# Patient Record
Sex: Female | Born: 1995 | Hispanic: Yes | Marital: Single | Smoking: Never smoker
Health system: Southern US, Community
[De-identification: ages and names within clinical notes are randomized; demographics above are authoritative.]

---

## 2021-10-03 ENCOUNTER — Emergency Department (HOSPITAL_COMMUNITY): Payer: Self-pay

## 2021-10-03 ENCOUNTER — Inpatient Hospital Stay (HOSPITAL_COMMUNITY): Payer: Self-pay

## 2021-10-03 ENCOUNTER — Inpatient Hospital Stay (HOSPITAL_COMMUNITY)
Admission: EM | Admit: 2021-10-03 | Discharge: 2021-10-05 | DRG: 121 | Disposition: A | Discharge status: Home / Self Care | Payer: Self-pay | Attending: Internal Medicine | Admitting: Internal Medicine

## 2021-10-03 ENCOUNTER — Encounter (HOSPITAL_COMMUNITY): Payer: Self-pay | Admitting: Emergency Medicine

## 2021-10-03 ENCOUNTER — Other Ambulatory Visit: Payer: Self-pay

## 2021-10-03 DIAGNOSIS — Z20822 Contact with and (suspected) exposure to covid-19: Secondary | ICD-10-CM | POA: Diagnosis present

## 2021-10-03 DIAGNOSIS — H469 Unspecified optic neuritis: Secondary | ICD-10-CM | POA: Diagnosis present

## 2021-10-03 DIAGNOSIS — H5712 Ocular pain, left eye: Secondary | ICD-10-CM | POA: Diagnosis present

## 2021-10-03 DIAGNOSIS — Z23 Encounter for immunization: Secondary | ICD-10-CM

## 2021-10-03 DIAGNOSIS — E871 Hypo-osmolality and hyponatremia: Secondary | ICD-10-CM | POA: Diagnosis present

## 2021-10-03 DIAGNOSIS — R030 Elevated blood-pressure reading, without diagnosis of hypertension: Secondary | ICD-10-CM | POA: Diagnosis present

## 2021-10-03 DIAGNOSIS — H05012 Cellulitis of left orbit: Principal | ICD-10-CM | POA: Diagnosis present

## 2021-10-03 LAB — CBC WITH DIFFERENTIAL/PLATELET
Abs Immature Granulocytes: 0.03 10*3/uL (ref 0.00–0.07)
Basophils Absolute: 0 10*3/uL (ref 0.0–0.1)
Basophils Relative: 0 %
Eosinophils Absolute: 0.2 10*3/uL (ref 0.0–0.5)
Eosinophils Relative: 2 %
HCT: 37.5 % (ref 36.0–46.0)
Hemoglobin: 11.8 g/dL — ABNORMAL LOW (ref 12.0–15.0)
Immature Granulocytes: 0 %
Lymphocytes Relative: 37 %
Lymphs Abs: 3.1 10*3/uL (ref 0.7–4.0)
MCH: 27.4 pg (ref 26.0–34.0)
MCHC: 31.5 g/dL (ref 30.0–36.0)
MCV: 87 fL (ref 80.0–100.0)
Monocytes Absolute: 0.4 10*3/uL (ref 0.1–1.0)
Monocytes Relative: 5 %
Neutro Abs: 4.7 10*3/uL (ref 1.7–7.7)
Neutrophils Relative %: 56 %
Platelets: 376 10*3/uL (ref 150–400)
RBC: 4.31 MIL/uL (ref 3.87–5.11)
RDW: 12.8 % (ref 11.5–15.5)
WBC: 8.4 10*3/uL (ref 4.0–10.5)
nRBC: 0 % (ref 0.0–0.2)

## 2021-10-03 LAB — COMPREHENSIVE METABOLIC PANEL
ALT: 13 U/L (ref 0–44)
AST: 12 U/L — ABNORMAL LOW (ref 15–41)
Albumin: 4.3 g/dL (ref 3.5–5.0)
Alkaline Phosphatase: 94 U/L (ref 38–126)
Anion gap: 5 (ref 5–15)
BUN: 8 mg/dL (ref 6–20)
CO2: 25 mmol/L (ref 22–32)
Calcium: 8.7 mg/dL — ABNORMAL LOW (ref 8.9–10.3)
Chloride: 108 mmol/L (ref 98–111)
Creatinine, Ser: 0.48 mg/dL (ref 0.44–1.00)
GFR, Estimated: >60 mL/min (ref >60)
Glucose, Bld: 100 mg/dL — ABNORMAL HIGH (ref 70–99)
Potassium: 4.6 mmol/L (ref 3.5–5.1)
Sodium: 138 mmol/L (ref 135–145)
Total Bilirubin: 0.3 mg/dL (ref 0.3–1.2)
Total Protein: 7.7 g/dL (ref 6.5–8.1)

## 2021-10-03 LAB — RESP PANEL BY RT-PCR (FLU A&B, COVID) ARPGX2
Influenza A by PCR: NEGATIVE
Influenza B by PCR: NEGATIVE
SARS Coronavirus 2 by RT PCR: NEGATIVE

## 2021-10-03 LAB — HCG, SERUM, QUALITATIVE: Preg, Serum: NEGATIVE

## 2021-10-03 IMAGING — CT CT ORBITS W/ CM
3 series · 14 of 47 positions shown, 16 images · IV contrast (omnipaque)
Comparison: No pertinent prior exam.

CLINICAL DATA: Orbital cellulitis

EXAM:
CT ORBITS WITH CONTRAST
TECHNIQUE: Multidetector CT images was performed according to the standard
protocol following intravenous contrast administration.
CONTRAST:  80mL OMNIPAQUE IOHEXOL 350 MG/ML SOLN

[Series 3: orbits 2.0 st · axial · 0.43mm/px · z∈[-119,-33]mm · 8 of 51 slices shown, 10 images]
[im 4/51  brain]
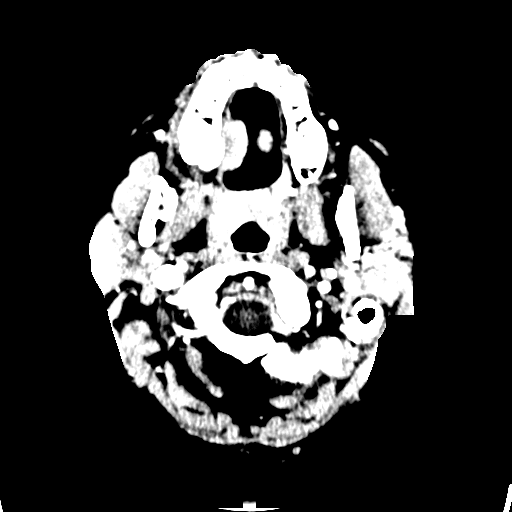
[im 4/51  bone]
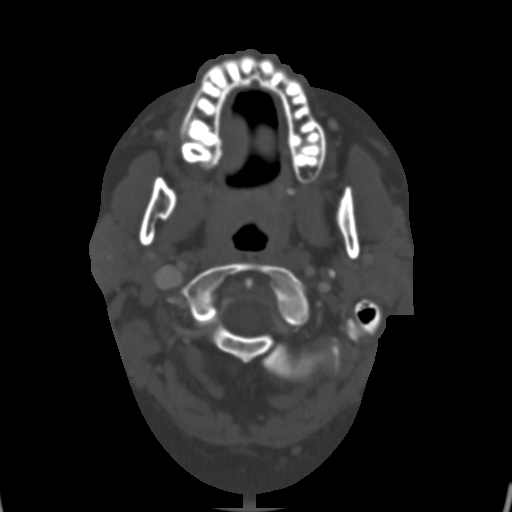
[im 11/51  bone]
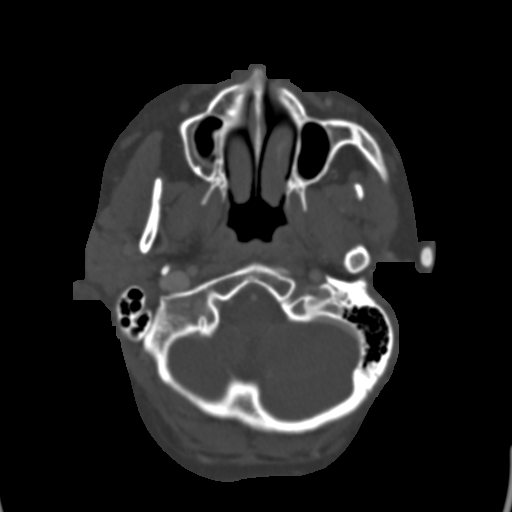
[im 16/51  bone]
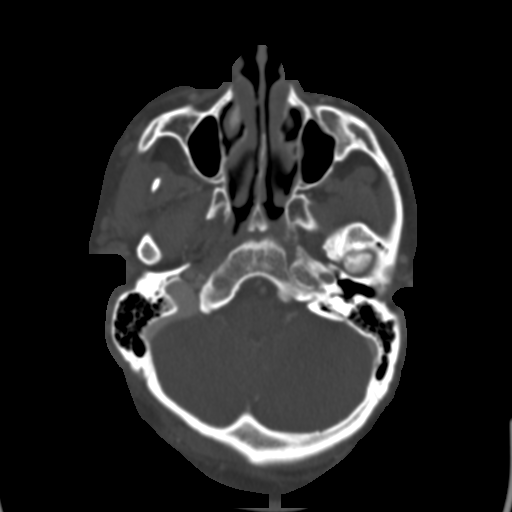
[im 23/51  bone]
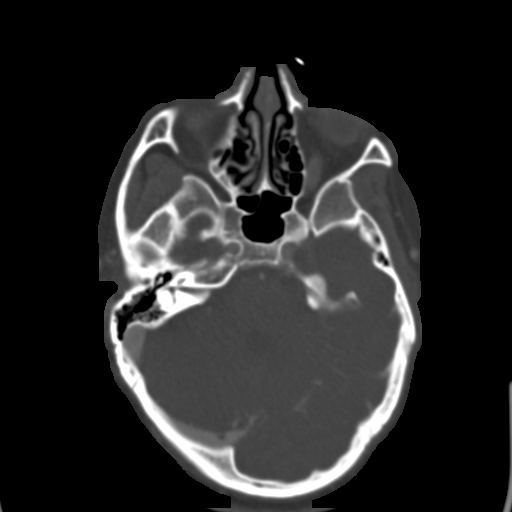
[im 28/51  brain]
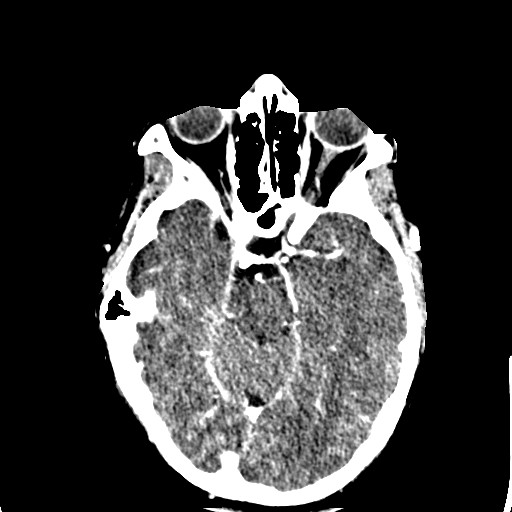
[im 28/51  bone]
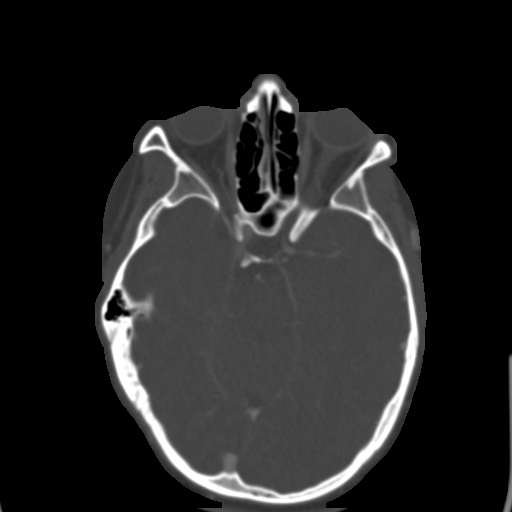
[im 35/51  bone]
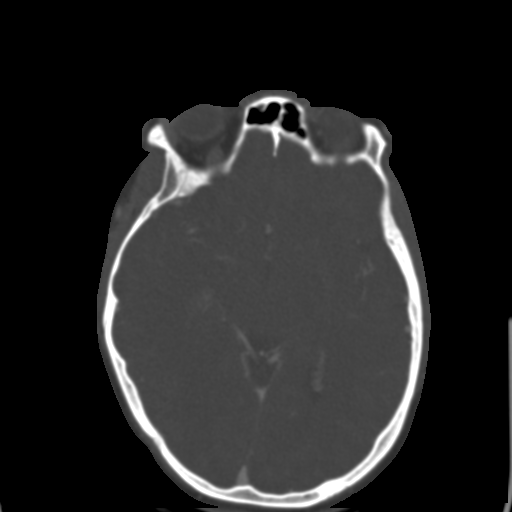
[im 40/51  bone]
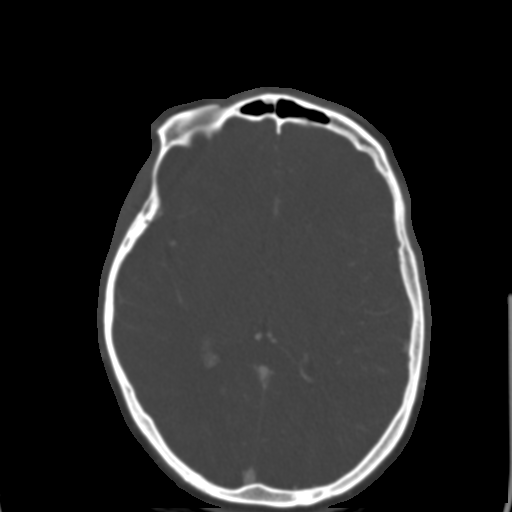
[im 47/51  bone]
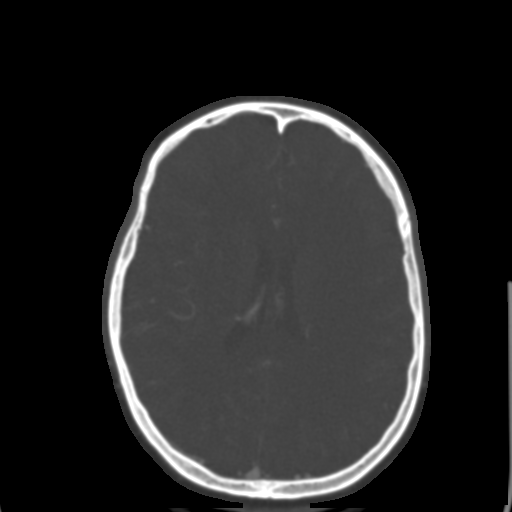

[Series 7: orbits coronal st · coronal · 0.23mm/px · 3 of 90 slices shown]
[im 30/90  bone]
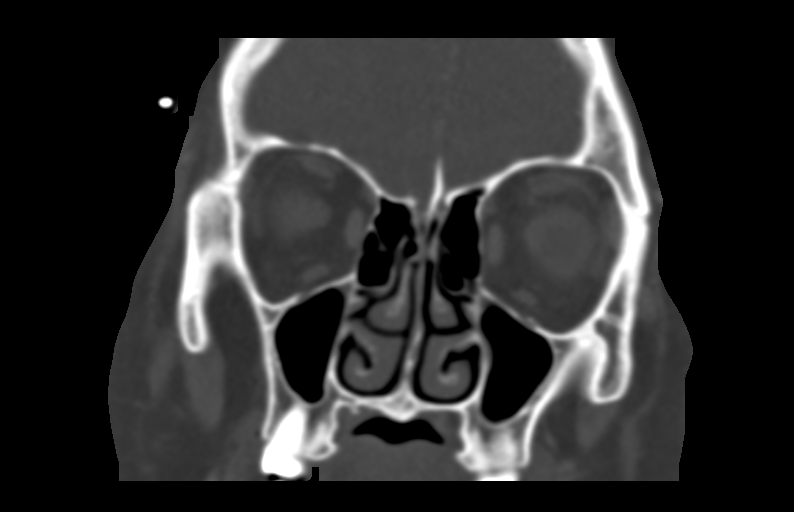
[im 40/90  bone]
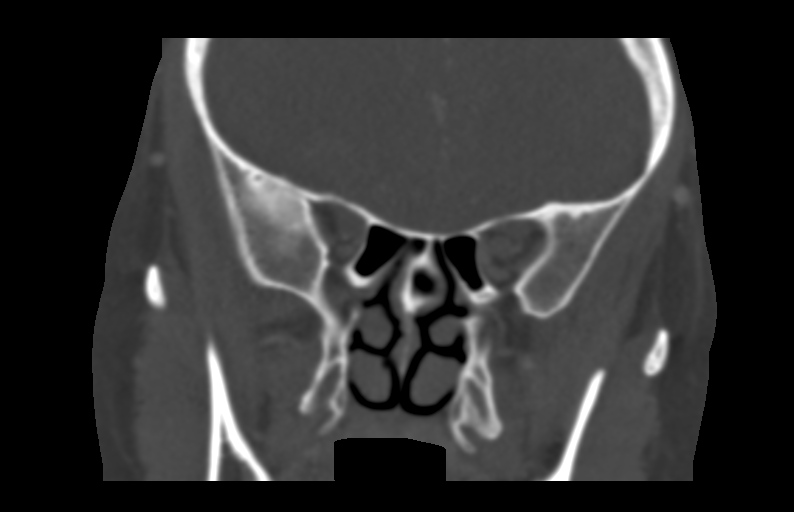
[im 50/90  bone]
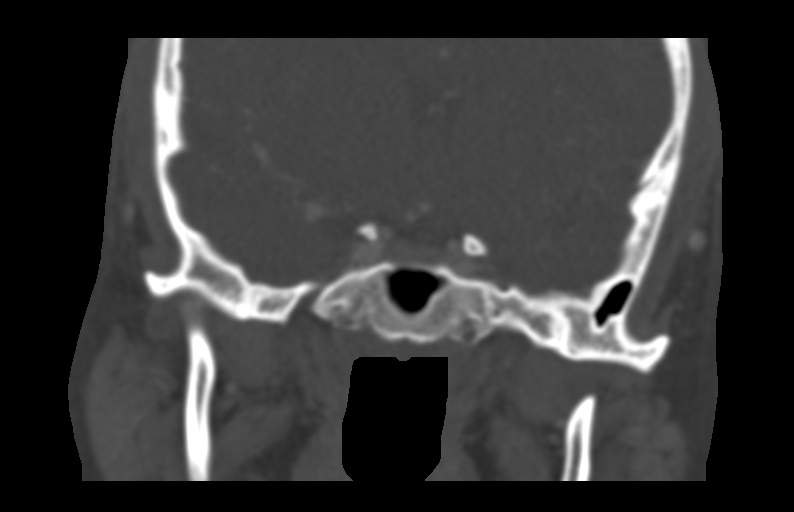

[Series 8: orbits sagittal st · sagittal · 0.27mm/px · 3 of 90 slices shown]
[im 30/90  bone]
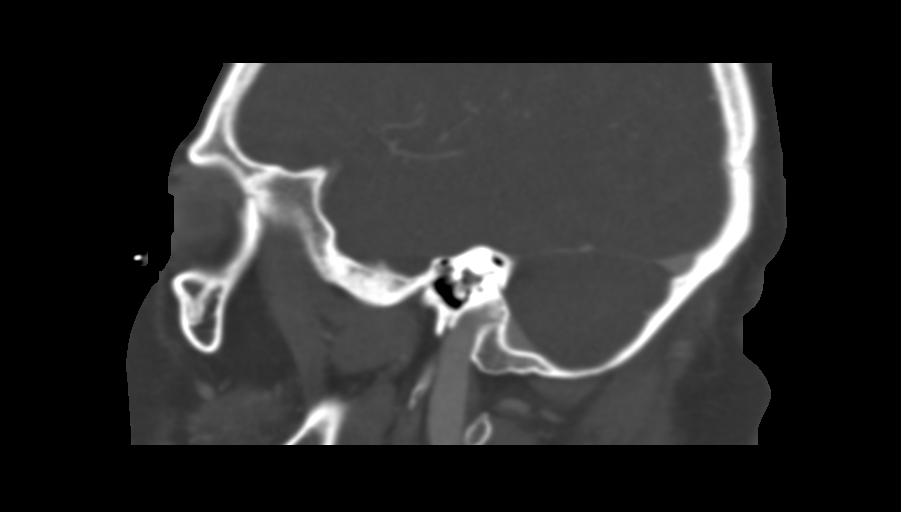
[im 45/90  bone]
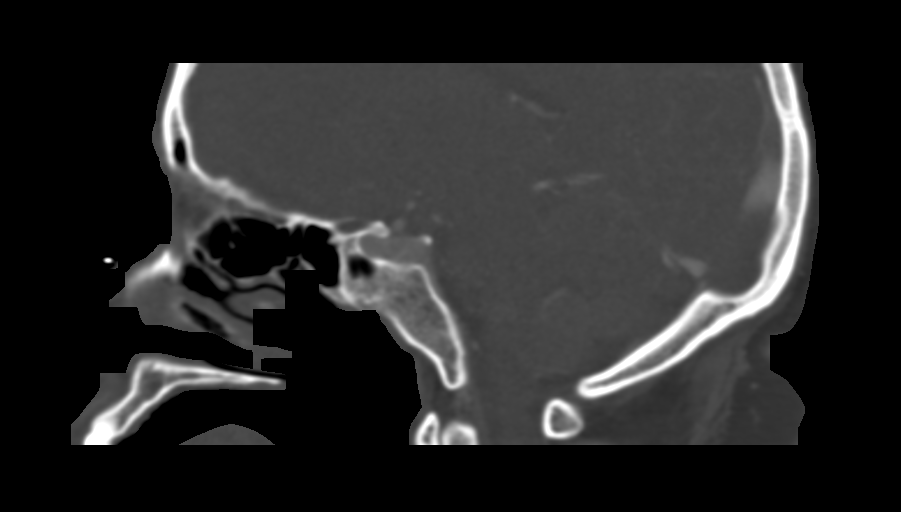
[im 60/90  bone]
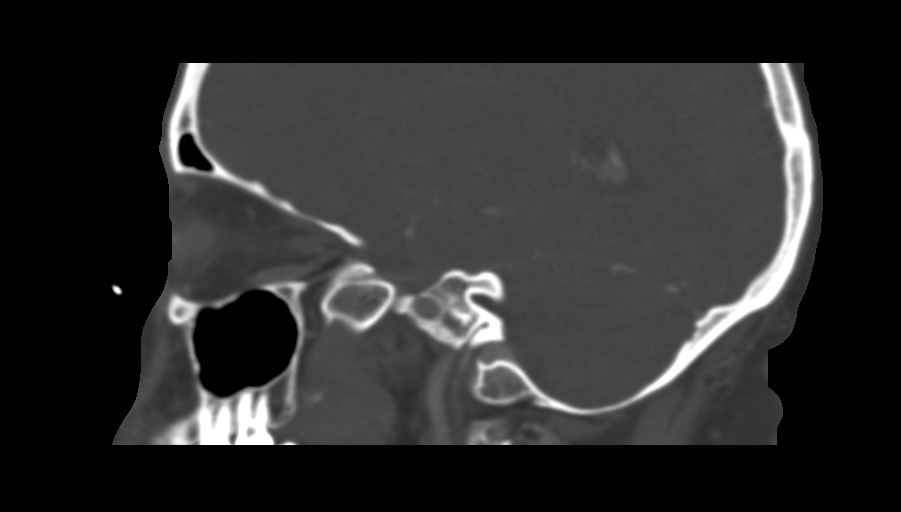

[14 of 47 positions shown; findings below may reference images not displayed]

FINDINGS: Orbits: Inferior and lateral to the left orbit is a dense nodular
area measuring 1.2 x 0.9 cm. There is no surrounding inflammatory
change. Additionally, there is mild thickening of the left superior
rectus muscle with hazy stranding in the adjacent fat. The optic
nerves and globes appear normal.

Visible paranasal sinuses: Clear.

Soft tissues: Normal.

Osseous: No fracture or aggressive lesion.

Limited intracranial: No acute or significant finding.
IMPRESSION: 1. Mild thickening of the left superior rectus muscle with hazy
stranding in the adjacent fat, possibly indicating orbital
cellulitis. This may indicate an inflammatory process, such as
idiopathic orbital inflammation (pseudotumor). Dedicated MRI orbits
with and without contrast recommended.
2. Dense nodule inferior and lateral to the left orbit, within the
deep subcutaneous tissues, favored to be a small vascular
malformation.

## 2021-10-03 IMAGING — MR MR ORBITS WO/W CM
7 of 8 series · 43 of 48 positions shown · IV contrast (GADAVIST)
Comparison: No pertinent prior exam.

CLINICAL DATA: Left eye pain

EXAM:
MRI OF THE ORBITS WITHOUT AND WITH CONTRAST
TECHNIQUE: Multiplanar, multi-echo pulse sequences of the orbits and
surrounding structures were acquired including fat saturation
techniques, before and after intravenous contrast administration.
CONTRAST:  10mL GADAVIST GADOBUTROL 1 MMOL/ML IV SOLN

[Series 5: T1 · sagittal · 5.0mm · 0.75mm/px · 5 of 24 slices shown (1 of 3)]
[im 1/24]
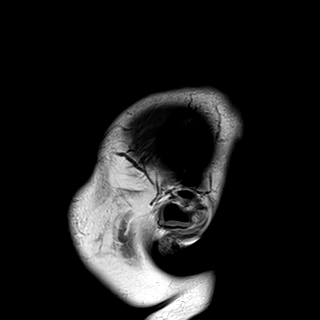
[im 6/24]
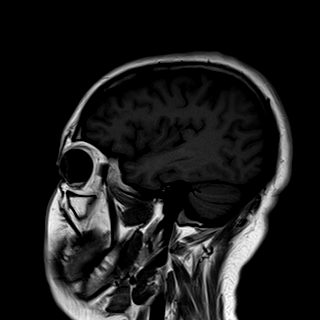
[im 12/24]
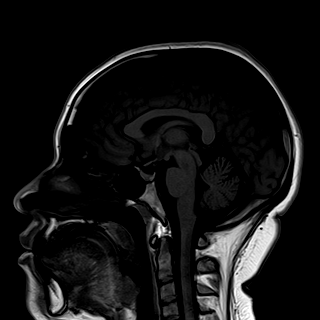
[im 18/24]
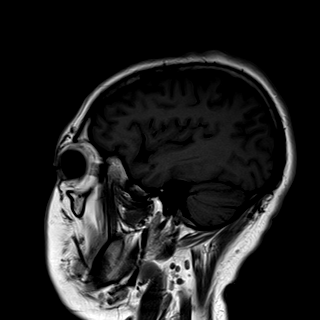
[im 24/24]
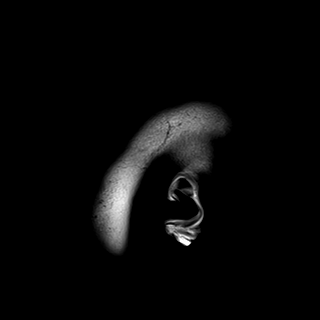

[Series 6: T2 fat-sat · axial · 3.0mm · 0.47mm/px · z∈[-17,+45]mm · 4 of 20 slices shown (1 of 2)]
[im 1/20]
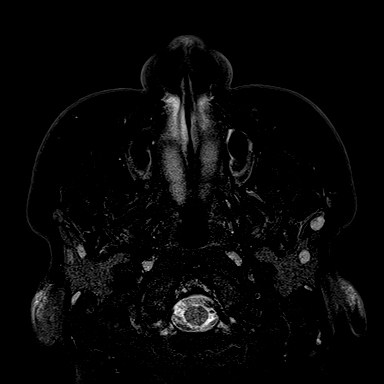
[im 7/20]
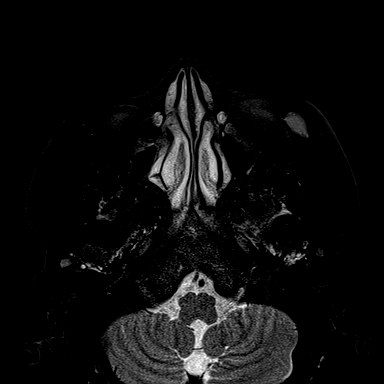
[im 13/20]
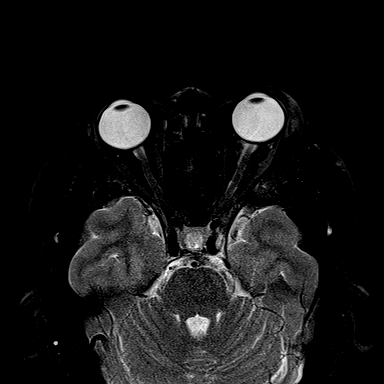
[im 20/20]
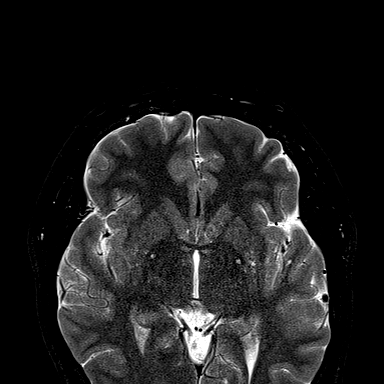

[Series 7: T1 · axial · 3.0mm · 0.56mm/px · z∈[-17,+45]mm · 5 of 20 slices shown (2 of 3)]
[im 1/20]
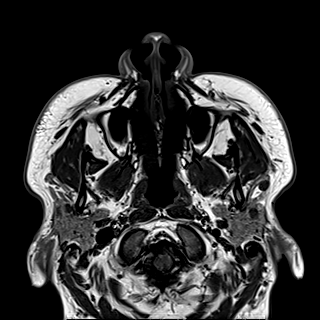
[im 5/20]
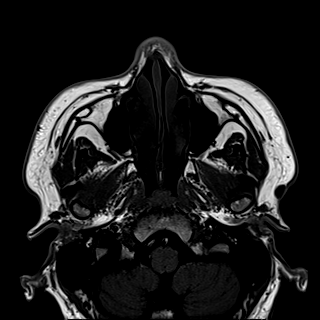
[im 10/20]
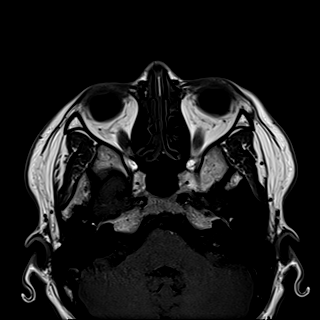
[im 15/20]
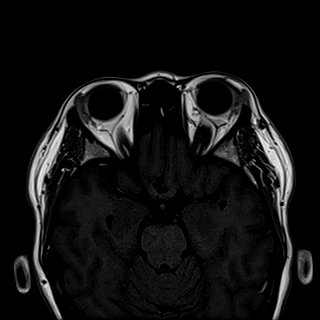
[im 20/20]
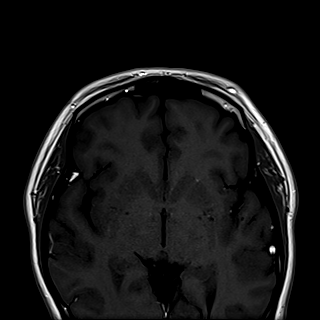

[Series 8: T2 fat-sat · coronal · 3.0mm · 0.47mm/px · 8 of 32 slices shown (2 of 2)]
[im 1/32]
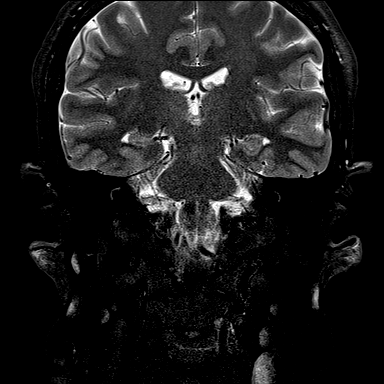
[im 5/32]
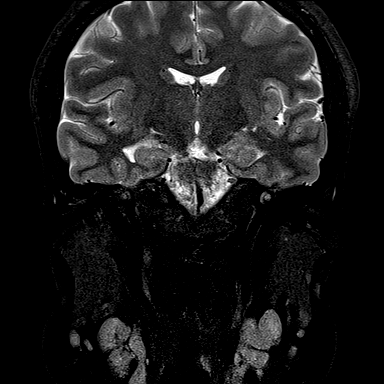
[im 9/32]
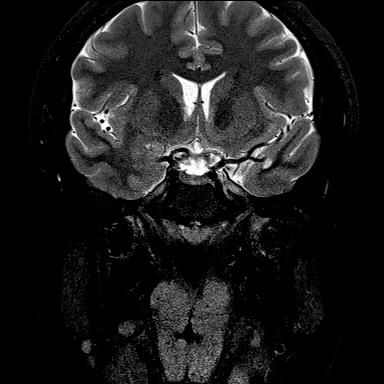
[im 14/32]
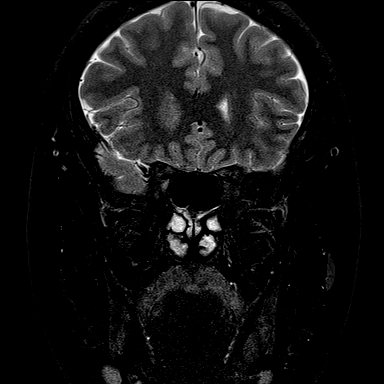
[im 18/32]
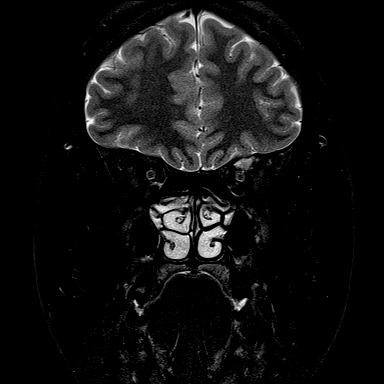
[im 23/32]
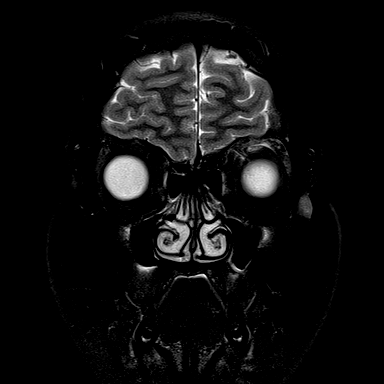
[im 27/32]
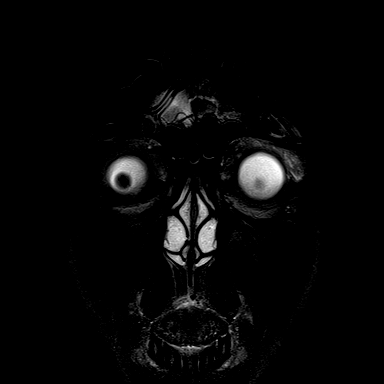
[im 32/32]
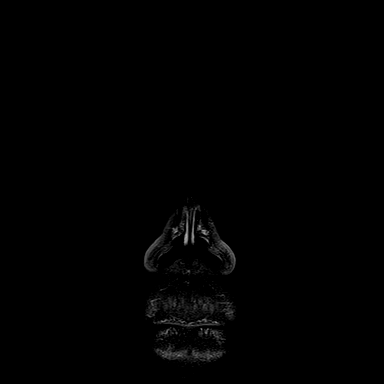

[Series 9: T1 · coronal · 3.0mm · 0.56mm/px · 8 of 32 slices shown (3 of 3)]
[im 1/32]
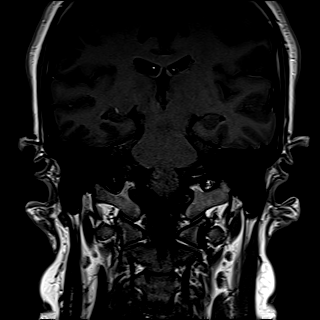
[im 5/32]
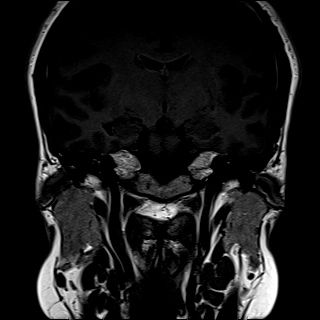
[im 9/32]
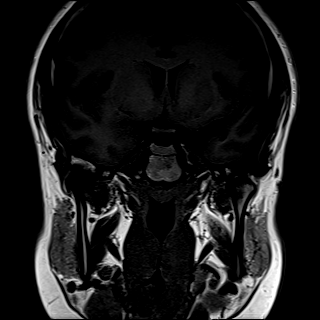
[im 14/32]
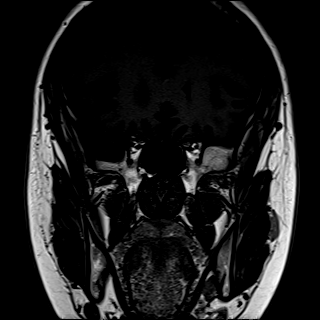
[im 18/32]
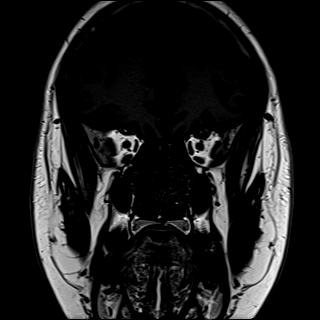
[im 23/32]
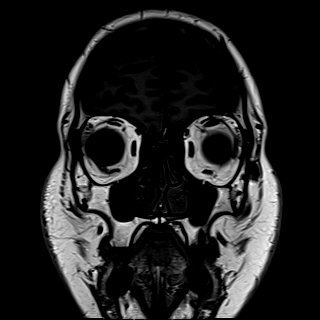
[im 27/32]
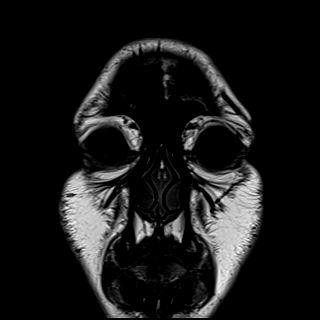
[im 32/32]
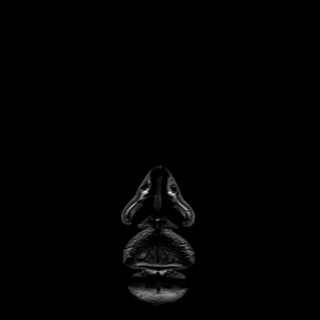

[Series 10: T1 fat-sat post-contrast · axial · 3.0mm · 0.56mm/px · z∈[-17,+45]mm · 5 of 20 slices shown (1 of 2)]
[im 1/20]
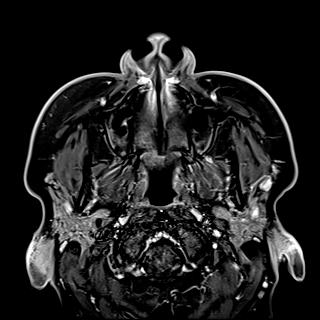
[im 5/20]
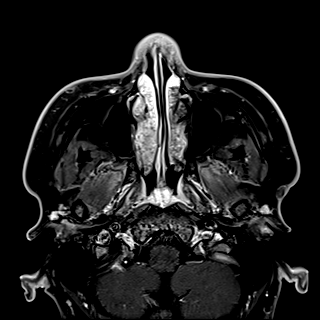
[im 10/20]
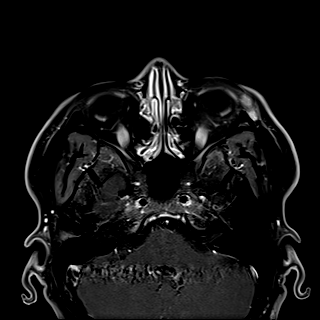
[im 15/20]
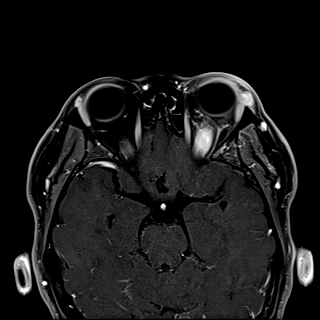
[im 20/20]
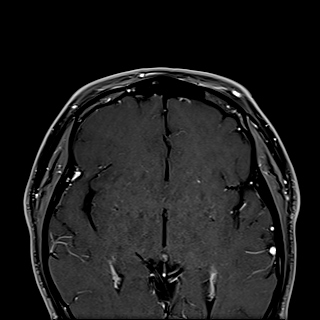

[Series 11: T1 fat-sat post-contrast · coronal · 3.0mm · 0.70mm/px · 8 of 32 slices shown (2 of 2)]
[im 1/32]
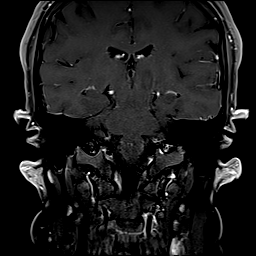
[im 5/32]
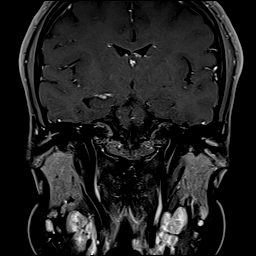
[im 9/32]
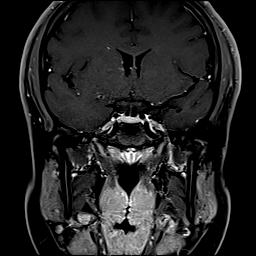
[im 14/32]
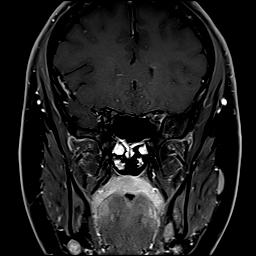
[im 18/32]
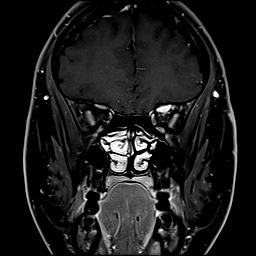
[im 23/32]
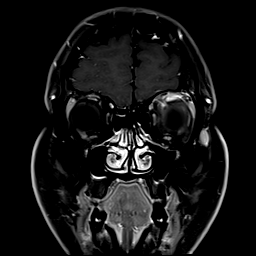
[im 27/32]
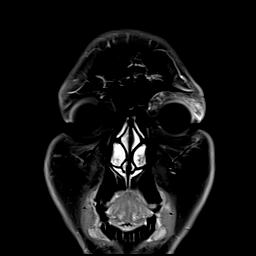
[im 32/32]
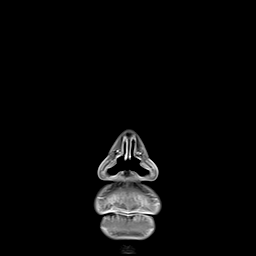

[43 of 48 positions shown; findings below may reference images not displayed]

FINDINGS: Orbits: There is inflammatory stranding around the left optic nerve,
but signal within the optic nerve itself is normal. The left
superior rectus muscle is enlarged and edematous with increased
contrast enhancement. The other left extraocular muscles are mildly
enlarged with some sparing of the inferior oblique. There is no
intraorbital fluid collection. Mild edema of the left lacrimal
gland.

Visualized sinuses: Clear

Soft tissues: Inferior and lateral to the left orbit, there is a
diffusely contrast-enhancing nodule that measures 1.6 x 0.6 cm. This
nodule shows low intrinsic T1 weighted signal and high T2-weighted
signal. There is no inflammatory change surrounding the nodule.

There are multiple enlarged upper cervical lymph nodes, measuring up
to 10 mm.

Limited intracranial: Normal
IMPRESSION: 1. Left optic neuritis, with inflammatory stranding around the left
optic nerve and the left lacrimal gland. The pattern is most
suggestive of idiopathic orbital inflammatory disease (pseudotumor).
2. Peripherally contrast-enhancing nodule inferior and lateral to
the left orbit, measuring 1.6 x 0.6 cm. Primary possibilities are
peripheral nerve schwannoma or cavernous venous malformation.
3. Multiple enlarged upper cervical lymph nodes, measuring up to 10
mm. This is nonspecific but may be reactive.

## 2021-10-03 MED ORDER — VANCOMYCIN HCL 2000 MG/400ML IV SOLN
2000.0000 mg | Freq: Once | INTRAVENOUS | Status: AC
  Administered 2021-10-04: 2000 mg via INTRAVENOUS
  Filled 2021-10-03: qty 400

## 2021-10-03 MED ORDER — GADOBUTROL 1 MMOL/ML IV SOLN
10.0000 mL | Freq: Once | INTRAVENOUS | Status: AC | PRN
  Administered 2021-10-03: 10 mL via INTRAVENOUS

## 2021-10-03 MED ORDER — ONDANSETRON HCL 4 MG PO TABS: 4.0000 mg | ORAL_TABLET | Freq: Four times a day (QID) | ORAL | Status: DC | PRN

## 2021-10-03 MED ORDER — ONDANSETRON HCL 4 MG/2ML IJ SOLN: 4.0000 mg | Freq: Four times a day (QID) | INTRAMUSCULAR | Status: DC | PRN

## 2021-10-03 MED ORDER — ACETAMINOPHEN 650 MG RE SUPP: 650.0000 mg | Freq: Four times a day (QID) | RECTAL | Status: DC | PRN

## 2021-10-03 MED ORDER — KETOROLAC TROMETHAMINE 15 MG/ML IJ SOLN
15.0000 mg | Freq: Four times a day (QID) | INTRAMUSCULAR | Status: DC | PRN
  Administered 2021-10-04 – 2021-10-05 (×3): 15 mg via INTRAVENOUS
  Filled 2021-10-03 (×3): qty 1

## 2021-10-03 MED ORDER — PIPERACILLIN-TAZOBACTAM 3.375 G IVPB 30 MIN
3.3750 g | Freq: Once | INTRAVENOUS | Status: AC
  Administered 2021-10-03: 3.375 g via INTRAVENOUS
  Filled 2021-10-03: qty 50

## 2021-10-03 MED ORDER — VANCOMYCIN HCL 1250 MG/250ML IV SOLN
1250.0000 mg | Freq: Three times a day (TID) | INTRAVENOUS | Status: DC
  Administered 2021-10-04: 1250 mg via INTRAVENOUS
  Filled 2021-10-03: qty 250

## 2021-10-03 MED ORDER — SODIUM CHLORIDE 0.9 % IV SOLN: INTRAVENOUS | Status: DC

## 2021-10-03 MED ORDER — SODIUM CHLORIDE 0.9 % IV SOLN
2.0000 g | Freq: Three times a day (TID) | INTRAVENOUS | Status: DC
  Administered 2021-10-04 – 2021-10-05 (×4): 2 g via INTRAVENOUS
  Filled 2021-10-03 (×5): qty 2

## 2021-10-03 MED ORDER — TRAZODONE HCL 50 MG PO TABS: 25.0000 mg | ORAL_TABLET | Freq: Every evening | ORAL | Status: DC | PRN

## 2021-10-03 MED ORDER — MORPHINE SULFATE (PF) 4 MG/ML IV SOLN
4.0000 mg | Freq: Once | INTRAVENOUS | Status: AC
  Administered 2021-10-03: 4 mg via INTRAVENOUS
  Filled 2021-10-03: qty 1

## 2021-10-03 MED ORDER — ACETAMINOPHEN 325 MG PO TABS
650.0000 mg | ORAL_TABLET | Freq: Four times a day (QID) | ORAL | Status: DC | PRN
  Administered 2021-10-04 – 2021-10-05 (×3): 650 mg via ORAL
  Filled 2021-10-03 (×4): qty 2

## 2021-10-03 MED ORDER — MAGNESIUM HYDROXIDE 400 MG/5ML PO SUSP: 30.0000 mL | Freq: Every day | ORAL | Status: DC | PRN

## 2021-10-03 MED ORDER — ENOXAPARIN SODIUM 40 MG/0.4ML IJ SOSY
40.0000 mg | PREFILLED_SYRINGE | INTRAMUSCULAR | Status: DC
  Administered 2021-10-04 – 2021-10-05 (×2): 40 mg via SUBCUTANEOUS
  Filled 2021-10-03 (×2): qty 0.4

## 2021-10-03 MED ORDER — IOHEXOL 350 MG/ML SOLN
80.0000 mL | Freq: Once | INTRAVENOUS | Status: AC | PRN
  Administered 2021-10-03: 80 mL via INTRAVENOUS

## 2021-10-03 NOTE — ED Notes (Signed)
Patient transported to CT 

## 2021-10-03 NOTE — ED Provider Notes (Signed)
[facility]-EMERGENCY DEPT Provider Note   CSN: [id number] Arrival date & time: 10/03/21  1515     History CC: Eye pain  Christy Ortega is a 25 y.o. female.  HPI  Patient presented to the ED for evaluation of left eye pain.  Patient symptoms started yesterday.  She is having pain in her eye that increases with movement.  The pain radiates to her ear and increases with breathing. No fever.  No vision changes.  No trauma  History reviewed. No pertinent past medical history.  Patient Active Problem List   Diagnosis Date Noted   Cellulitis of left orbital region 10/03/2021     History reviewed. No pertinent surgical history.   OB History   No obstetric history on file.     No family history on file.     Home Medications Prior to Admission medications   Not on File    Allergies    Patient has no known allergies.  Review of Systems   Review of Systems  All other systems reviewed and are negative.  Physical Exam Updated Vital Signs BP 124/78   Pulse 63   Temp 97.9 F (36.6 C) (Oral)   Resp 17   Ht 1.7 m (5' 6.93")   Wt 95 kg   LMP 10/02/2021 (Exact Date)   SpO2 100%   BMI 32.87 kg/m   Physical Exam Vitals and nursing note reviewed.  Constitutional:      General: She is not in acute distress.    Appearance: She is well-developed.  HENT:     Head: Normocephalic and atraumatic.     Right Ear: External ear normal.     Left Ear: External ear normal.  Eyes:     General: No scleral icterus.       Right eye: No discharge.        Left eye: No discharge.     Extraocular Movements: Extraocular movements intact.     Conjunctiva/sclera: Conjunctivae normal.     Pupils: Pupils are equal, round, and reactive to light.     Comments: Tenderness palpation around the left periorbital region, pain with range of motion of the left eye  Neck:     Trachea: No tracheal deviation.  Cardiovascular:     Rate and Rhythm: Normal rate.   Pulmonary:     Effort: Pulmonary effort is normal. No respiratory distress.     Breath sounds: No stridor.  Abdominal:     General: There is no distension.  Musculoskeletal:        General: No swelling or deformity.     Cervical back: Neck supple.  Skin:    General: Skin is warm and dry.     Findings: No rash.  Neurological:     Mental Status: She is alert.     Cranial Nerves: Cranial nerve deficit: no gross deficits.    ED Results / Procedures / Treatments   Labs (all labs ordered are listed, but only abnormal results are displayed) Labs Reviewed  CBC WITH DIFFERENTIAL/PLATELET - Abnormal; Notable for the following components:      Result Value   Hemoglobin 11.8 (*)    All other components within normal limits  COMPREHENSIVE METABOLIC PANEL - Abnormal; Notable for the following components:   Glucose, Bld 100 (*)    Calcium 8.7 (*)    AST 12 (*)    All other components within normal limits  RESP PANEL BY RT-PCR (FLU A&B, COVID) ARPGX2  CULTURE,  BLOOD (ROUTINE X 2)  CULTURE, BLOOD (ROUTINE X 2)  HCG, SERUM, QUALITATIVE  HIV ANTIBODY (ROUTINE TESTING W REFLEX)  BASIC METABOLIC PANEL  CBC    EKG None  Radiology CT Orbits W Contrast  Result Date: 10/03/2021 CLINICAL DATA:  Orbital cellulitis EXAM: CT ORBITS WITH CONTRAST TECHNIQUE: Multidetector CT images was performed according to the standard protocol following intravenous contrast administration. CONTRAST:  21mL OMNIPAQUE IOHEXOL 350 MG/ML SOLN COMPARISON:  No pertinent prior exam. FINDINGS: Orbits: Inferior and lateral to the left orbit is a dense nodular area measuring 1.2 x 0.9 cm. There is no surrounding inflammatory change. Additionally, there is mild thickening of the left superior rectus muscle with hazy stranding in the adjacent fat. The optic nerves and globes appear normal. Visible paranasal sinuses: Clear. Soft tissues: Normal. Osseous: No fracture or aggressive lesion. Limited intracranial: No acute or  significant finding. IMPRESSION: 1. Mild thickening of the left superior rectus muscle with hazy stranding in the adjacent fat, possibly indicating orbital cellulitis. This may indicate an inflammatory process, such as idiopathic orbital inflammation (pseudotumor). Dedicated MRI orbits with and without contrast recommended. 2. 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 Electronically Signed   By: Deatra Robinson M.D.   On: 10/03/2021 21:55    Procedures Procedures   Medications Ordered in ED Medications  enoxaparin (LOVENOX) injection 40 mg (has no administration in time range)  0.9 %  sodium chloride infusion (has no administration in time range)  acetaminophen (TYLENOL) tablet 650 mg (has no administration in time range)    Or  acetaminophen (TYLENOL) suppository 650 mg (has no administration in time range)  ketorolac (TORADOL) 15 MG/ML injection 15 mg (has no administration in time range)  traZODone (DESYREL) tablet 25 mg (has no administration in time range)  magnesium hydroxide (MILK OF MAGNESIA) suspension 30 mL (has no administration in time range)  ondansetron (ZOFRAN) tablet 4 mg (has no administration in time range)    Or  ondansetron (ZOFRAN) injection 4 mg (has no administration in time range)  vancomycin (VANCOREADY) IVPB 2000 mg/400 mL (has no administration in time range)  ceFEPIme (MAXIPIME) 2 g in sodium chloride 0.9 % 100 mL IVPB (has no administration in time range)  iohexol (OMNIPAQUE) 350 MG/ML injection 80 mL (80 mLs Intravenous Contrast Given 10/03/21 2057)  piperacillin-tazobactam (ZOSYN) IVPB 3.375 g (0 g Intravenous Stopped 10/03/21 2317)  morphine 4 MG/ML injection 4 mg (4 mg Intravenous Given 10/03/21 2301)    ED Course  I have reviewed the triage vital signs and the nursing notes.  Pertinent labs & imaging results that were available during my care of the patient were  reviewed by me and considered in my medical decision making (see chart for details).  Clinical Course as of 10/03/21 2323  Tue Oct 03, 2021  2200 Labs are unremarkable [JK]  2201 CT scan does show evidence of inflammation around the superior rectus muscle.  Orbital cellulitis is a possibility.  MRI of orbits recommended [JK]  2323 Discussed case with Dr Allena Katz.  He will consult on patient in the am [JK]    Clinical Course User Index [JK] Linwood Dibbles, MD   MDM Rules/Calculators/A&P                           Patient presented to the ED with complaints of left eye pain.  Findings concerning for  the possibility of orbital cellulitis.  Patient is afebrile.  No white count but the CT scan does show evidence of inflammation in the superior rectus muscle.  We will start the patient on antibiotics.  Abdomen of the orbits recommended.  I will consult medical service admission and consult with ophthalmology. Final Clinical Impression(s) / ED Diagnoses Final diagnoses:  Cellulitis of left orbital region     Linwood Dibbles, MD 10/03/21 2324

## 2021-10-03 NOTE — ED Provider Notes (Signed)
Emergency Medicine Provider Triage Evaluation Note  Christy Ortega , a 25 y.o. female  was evaluated in triage.  Pt complains of left eye pain x yesterday. Describes the pain as sharp to the upper aspect of her eye, worsen with movement however EOM are preserved. She denies any trauma, fever, or loss of vision.   Review of Systems  Positive: Eye pain, skin Negative: Vision changes, loss of vision, trauma  Physical Exam  BP (!) 143/96   Pulse 71   Temp 97.9 F (36.6 C) (Oral)   Resp 16   Ht 5' 6.93" (1.7 m)   Wt 95 kg   LMP 10/02/2021 (Exact Date)   SpO2 100%   BMI 32.87 kg/m  Gen:   Awake, no distress   Resp:  Normal effort  MSK:   Moves extremities without difficulty  Other:    Medical Decision Making  Medically screening exam initiated at 5:01 PM.  Appropriate orders placed.  Christy Ortega was informed that the remainder of the evaluation will be completed by another provider, this initial triage assessment does not replace that evaluation, and the importance of remaining in the ED until their evaluation is complete.  Pre septal versus post septal, concern with atraumatic redness and pain with movement. CT orbits has been ordered from [facility].    Claude Manges, PA-C 10/03/21 1703    Derwood Kaplan, MD 10/03/21 2032

## 2021-10-03 NOTE — H&P (Signed)
[facility]   PATIENT NAME: Christy Ortega    MR#:  [id number]  DATE OF BIRTH:  July 31, 1996  DATE OF ADMISSION:  10/03/2021  PRIMARY CARE PHYSICIAN: Pcp, No   Patient is coming from: Home  REQUESTING/REFERRING PHYSICIAN: Linwood Dibbles, MD  CHIEF COMPLAINT:  Left eye pain and swelling.  The patient is Spanish speaking and therefore history is obtained through an interpreter. HISTORY OF PRESENT ILLNESS:  Christy Ortega is a 25 y.o. Hispanic female with no significant medical history presenting to the emergency room with a Kalisetti of left eye pain that started yesterday and swelling with redness that started earlier today with associated tenderness.  She has been having excessive lacrimation.  She admitted to mild headache without dizziness or blurred vision.  No visual field defects.  No recent trauma or irritation.  She denied any nausea or vomiting.  No chest pain or dyspnea or cough or wheezing.  No bleeding diathesis.  She denies any abdominal pain.  No dysuria or hematuria or flank pain.  ED Course: When she came to the ER blood pressure was 142/87 and later 124/78.  Vital signs were otherwise normal.  Labs revealed unremarkable CMP and CBC.  Serum pregnancy test was negative.  Respiratory panel is currently pending.  Imaging: Left orbital CT scan showed the following: 1. Mild thickening of the left superior rectus muscle with hazy stranding in the adjacent fat, possibly indicating orbital cellulitis. This may indicate an inflammatory process, such as idiopathic orbital inflammation (pseudotumor). Dedicated MRI orbits with and without contrast recommended. 2. 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  Orbital MRI is currently pending.  The patient was given 4 mg IV morphine sulfate and 3.375 g of IV Zosyn.  Dr. Allena Katz with ophthalmology was notified about  the patient she will be admitted to a medical bed for further evaluation and management. PAST MEDICAL HISTORY:  History reviewed. No pertinent past medical history.  She denies any chronic medical problems.  PAST SURGICAL HISTORY:  History reviewed. No pertinent surgical history.  She denies any previous surgeries.  SOCIAL HISTORY:   Social History   Tobacco Use   Smoking status: Not on file   Smokeless tobacco: Not on file  Substance Use Topics   Alcohol use: Not on file  She drinks alcohol occasionally.  No history of tobacco abuse or illicit drug use.  FAMILY HISTORY:  No family history on file.  She denies any familial diseases.  DRUG ALLERGIES:  No Known Allergies  REVIEW OF SYSTEMS:   ROS As per history of present illness. All pertinent systems were reviewed above. Constitutional, HEENT, cardiovascular, respiratory, GI, GU, musculoskeletal, neuro, psychiatric, endocrine, integumentary and hematologic systems were reviewed and are otherwise negative/unremarkable except for positive findings mentioned above in the HPI.   MEDICATIONS AT HOME:   Prior to Admission medications   Not on File      VITAL SIGNS:  Blood pressure 124/78, pulse 63, temperature 97.9 F (36.6 C), temperature source Oral, resp. rate 17, height 5' 6.93" (1.7 m), weight 95 kg, last menstrual period 10/02/2021, SpO2 100 %.  PHYSICAL EXAMINATION:  Physical Exam  GENERAL:  25 y.o.-year-old Hispanic female patient lying in the bed with no acute distress.  EYES: Pupils equal, round, reactive to light and accommodation.  Left periorbital swelling with infraorbital erythema, induration and tenderness without fluctuation.  No scleral icterus. Extraocular muscles  intact.  HEENT: Head atraumatic, normocephalic. Oropharynx and nasopharynx clear.  NECK:  Supple, no jugular venous distention. No thyroid enlargement, no tenderness.  LUNGS: Normal breath sounds bilaterally, no wheezing, rales,rhonchi or  crepitation. No use of accessory muscles of respiration.  CARDIOVASCULAR: Regular rate and rhythm, S1, S2 normal. No murmurs, rubs, or gallops.  ABDOMEN: Soft, nondistended, nontender. Bowel sounds present. No organomegaly or mass.  EXTREMITIES: No pedal edema, cyanosis, or clubbing.  NEUROLOGIC: Cranial nerves II through XII are intact. Muscle strength 5/5 in all extremities. Sensation intact. Gait not checked.  PSYCHIATRIC: The patient is alert and oriented x 3.  Normal affect and good eye contact. SKIN: No obvious rash, lesion, or ulcer.   LABORATORY PANEL:   CBC Recent Labs  Lab 10/03/21 1659  WBC 8.4  HGB 11.8*  HCT 37.5  PLT 376   ------------------------------------------------------------------------------------------------------------------  Chemistries  Recent Labs  Lab 10/03/21 1659  NA 138  K 4.6  CL 108  CO2 25  GLUCOSE 100*  BUN 8  CREATININE 0.48  CALCIUM 8.7*  AST 12*  ALT 13  ALKPHOS 94  BILITOT 0.3   ------------------------------------------------------------------------------------------------------------------  Cardiac Enzymes No results for input(s): TROPONINI in the last 168 hours. ------------------------------------------------------------------------------------------------------------------  RADIOLOGY:  CT Orbits W Contrast  Result Date: 10/03/2021 CLINICAL DATA:  Orbital cellulitis EXAM: CT ORBITS WITH CONTRAST TECHNIQUE: Multidetector CT images was performed according to the standard protocol following intravenous contrast administration. CONTRAST:  61mL OMNIPAQUE IOHEXOL 350 MG/ML SOLN COMPARISON:  No pertinent prior exam. FINDINGS: Orbits: Inferior and lateral to the left orbit is a dense nodular area measuring 1.2 x 0.9 cm. There is no surrounding inflammatory change. Additionally, there is mild thickening of the left superior rectus muscle with hazy stranding in the adjacent fat. The optic nerves and globes appear normal. Visible  paranasal sinuses: Clear. Soft tissues: Normal. Osseous: No fracture or aggressive lesion. Limited intracranial: No acute or significant finding. IMPRESSION: 1. Mild thickening of the left superior rectus muscle with hazy stranding in the adjacent fat, possibly indicating orbital cellulitis. This may indicate an inflammatory process, such as idiopathic orbital inflammation (pseudotumor). Dedicated MRI orbits with and without contrast recommended. 2. 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 Electronically Signed   By: Ulyses Jarred M.D.   On: 10/03/2021 21:55      IMPRESSION AND PLAN:  Active Problems:   Cellulitis of left orbital region  1.  Acute left orbital nonpurulent cellulitis. - The patient will be admitted to a medical bed. - We will continue antibiotic therapy with IV vancomycin and cefepime for for moderate to severe nonpurulent cellulitis. - Pain management will be provided. - Warm compresses will be utilized. - Ophthalmology consult to be obtained. - Dr. Posey Pronto was notified by the patient.  2.  Elevated blood pressure. - This likely secondary to pain. - BP will be followed.  DVT prophylaxis: Lovenox. Code Status: full code. Family Communication:  The plan of care was discussed in details with the patient (and family). I answered all questions. The patient agreed to proceed with the above mentioned plan. Further management will depend upon hospital course. Disposition Plan: Back to previous home environment Consults called: Ophthalmology.  All the records are reviewed and case discussed with ED provider.  Status is: Inpatient  Remains inpatient appropriate because:Ongoing diagnostic testing needed not appropriate for outpatient work up, Unsafe d/c plan, IV treatments appropriate due to intensity of  illness or inability to take PO, and Inpatient level of care appropriate due to  severity of illness   Dispo: The patient is from: Home              Anticipated d/c is to: Home              Patient currently is not medically stable to d/c.              Difficult to place patient: No  TOTAL TIME TAKING CARE OF THIS PATIENT: 50 minutes.     Christel Mormon M.D on 10/03/2021 at 11:07 PM  [facility]   From 7 PM-7 AM, contact night-coverage [email or web address]  CC: Primary care physician; Pcp, No

## 2021-10-03 NOTE — Progress Notes (Signed)
Pharmacy Antibiotic Note  Christy Ortega is a 25 y.o. female admitted on 10/03/2021 with cellulitis.  Pharmacy has been consulted for Vancomycin & Cefepime dosing.  Plan: Cefepime 2gm IV q8h Vancomycin 2gm IV x1 followed by 1250mg  IV q8h to target AUC 400-550 Check Vancomycin levels at steady state Monitor renal function and cx data   Height: 5' 6.93" (170 cm) Weight: 95 kg (209 lb 7 oz) IBW/kg (Calculated) : 61.44  Temp (24hrs), Avg:97.9 F (36.6 C), Min:97.9 F (36.6 C), Max:97.9 F (36.6 C)  Recent Labs  Lab 10/03/21 1659  WBC 8.4  CREATININE 0.48    Estimated Creatinine Clearance: 124.7 mL/min (by C-G formula based on SCr of 0.48 mg/dL).    No Known Allergies  Antimicrobials this admission: 11/2 Vancomycin >>  11/2 Cefepime >>  11/1 Zosyn x1  Dose adjustments this admission:  Microbiology results: BCx:   Thank you for allowing pharmacy to be a part of this patient's care.  13/1 PharmD 10/03/2021 11:13 PM

## 2021-10-03 NOTE — ED Notes (Signed)
To MRI

## 2021-10-03 NOTE — ED Triage Notes (Signed)
Complains of L eye swelling and pain to palpation  of L cheekbone, denies trauma. Denies redness and inflammation, denies vision changes.

## 2021-10-04 ENCOUNTER — Encounter (HOSPITAL_COMMUNITY): Payer: Self-pay | Admitting: Family Medicine

## 2021-10-04 ENCOUNTER — Inpatient Hospital Stay (HOSPITAL_COMMUNITY): Payer: Self-pay

## 2021-10-04 LAB — CBC
HCT: 36.9 % (ref 36.0–46.0)
Hemoglobin: 11.5 g/dL — ABNORMAL LOW (ref 12.0–15.0)
MCH: 27.4 pg (ref 26.0–34.0)
MCHC: 31.2 g/dL (ref 30.0–36.0)
MCV: 88.1 fL (ref 80.0–100.0)
Platelets: 353 10*3/uL (ref 150–400)
RBC: 4.19 MIL/uL (ref 3.87–5.11)
RDW: 12.7 % (ref 11.5–15.5)
WBC: 11.8 10*3/uL — ABNORMAL HIGH (ref 4.0–10.5)
nRBC: 0 % (ref 0.0–0.2)

## 2021-10-04 LAB — BASIC METABOLIC PANEL
Anion gap: 9 (ref 5–15)
BUN: 7 mg/dL (ref 6–20)
CO2: 21 mmol/L — ABNORMAL LOW (ref 22–32)
Calcium: 8.1 mg/dL — ABNORMAL LOW (ref 8.9–10.3)
Chloride: 104 mmol/L (ref 98–111)
Creatinine, Ser: 0.52 mg/dL (ref 0.44–1.00)
GFR, Estimated: >60 mL/min (ref >60)
Glucose, Bld: 104 mg/dL — ABNORMAL HIGH (ref 70–99)
Potassium: 3.5 mmol/L (ref 3.5–5.1)
Sodium: 134 mmol/L — ABNORMAL LOW (ref 135–145)

## 2021-10-04 LAB — HIV ANTIBODY (ROUTINE TESTING W REFLEX): HIV Screen 4th Generation wRfx: NONREACTIVE

## 2021-10-04 IMAGING — MR MR HEAD WO/W CM
15 series · 48 of 48 positions shown · IV contrast (GADAVIST)
Comparison: MRI orbits [DATE]

CLINICAL DATA: Left orbital inflammation

EXAM:
MRI HEAD WITHOUT AND WITH CONTRAST
TECHNIQUE: Multiplanar, multiecho pulse sequences of the brain and surrounding
structures were obtained without and with intravenous contrast.
CONTRAST:  10mL GADAVIST GADOBUTROL 1 MMOL/ML IV SOLN

[Series 5: DWI · axial · 3.0mm · 1.31mm/px · z∈[-95,+43]mm · 5 of 96 slices shown (1 of 2)]
[im 1/96]
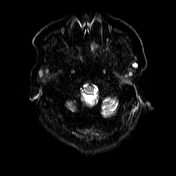
[im 24/96]
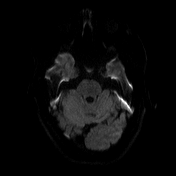
[im 48/96]
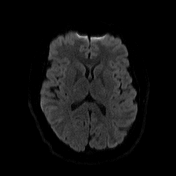
[im 72/96]
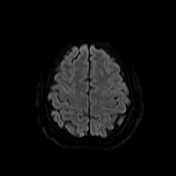
[im 96/96]
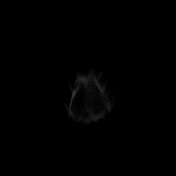

[Series 6: DWI · axial · 3.0mm · 1.31mm/px · z∈[-95,+43]mm · 3 of 47 slices shown (2 of 2)]
[im 1/47]
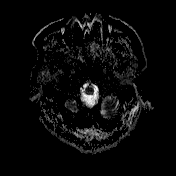
[im 24/47]
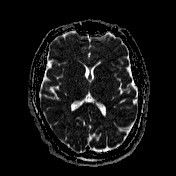
[im 47/47]
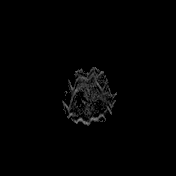

[Series 7: T1 · sagittal · 5.0mm · 0.72mm/px · 1 of 24 slices shown (1 of 4)]
[im 1/24]
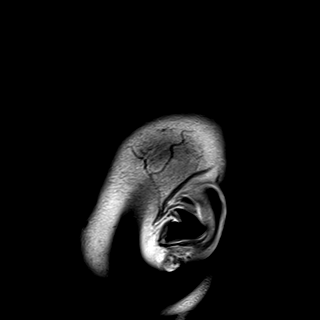

[Series 8: T2 · axial · 5.0mm · 0.60mm/px · 1 of 26 slices shown]
[im 1/26]
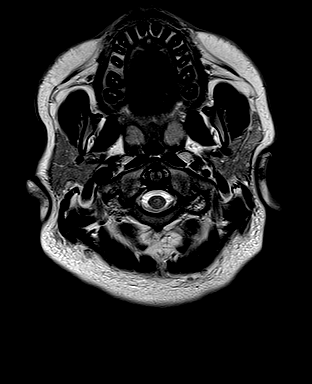

[Series 9: swi_images · axial · 3.0mm · 0.72mm/px · z∈[-114,+59]mm · 3 of 60 slices shown]
[im 1/60]
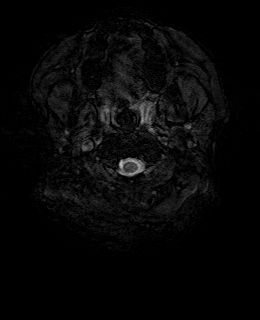
[im 30/60]
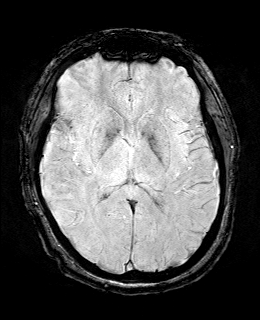
[im 60/60]
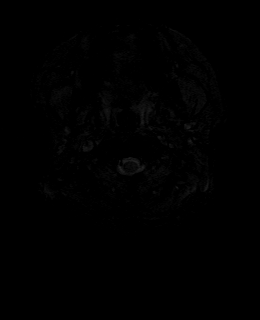

[Series 11: FLAIR · axial · 3.0mm · 0.72mm/px · z∈[-102,+48]mm · 3 of 52 slices shown]
[im 1/52]
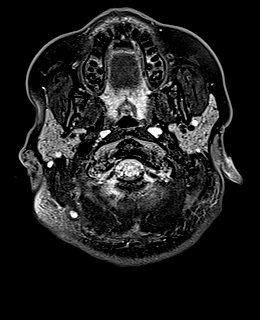
[im 26/52]
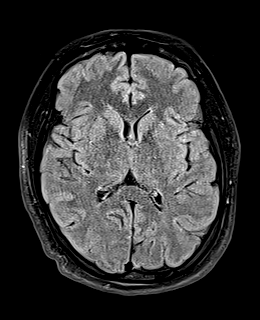
[im 52/52]
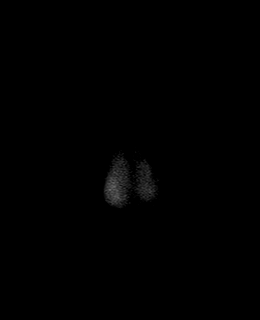

[Series 12: T1 · axial · 1.0mm · 0.90mm/px · z∈[-103,+52]mm · 9 of 160 slices shown (2 of 4)]
[im 1/160]
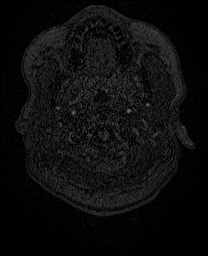
[im 20/160]
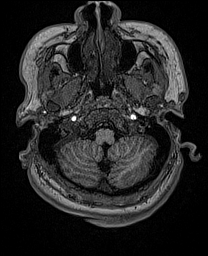
[im 40/160]
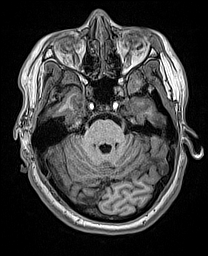
[im 60/160]
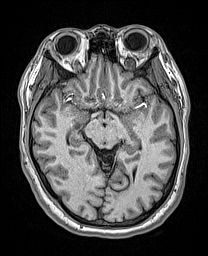
[im 80/160]
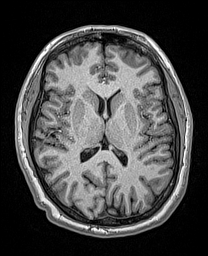
[im 100/160]
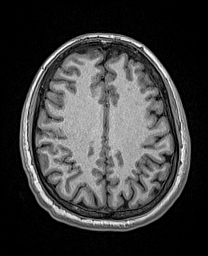
[im 120/160]
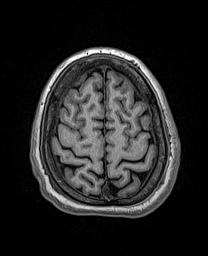
[im 140/160]
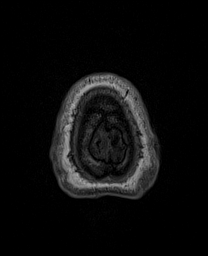
[im 160/160]
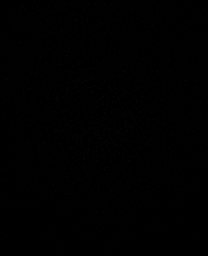

[Series 13: cor dwi_tracew · coronal · 5.0mm · 1.53mm/px · 3 of 60 slices shown]
[im 1/60]
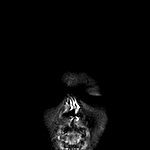
[im 30/60]
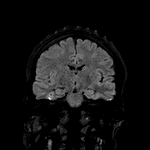
[im 60/60]
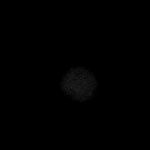

[Series 14: cor dwi_adc · coronal · 5.0mm · 1.53mm/px · 2 of 30 slices shown]
[im 1/30]
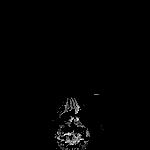
[im 30/30]
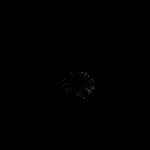

[Series 15: T2 post-contrast · coronal · 5.0mm · 0.57mm/px · 2 of 32 slices shown]
[im 1/32]
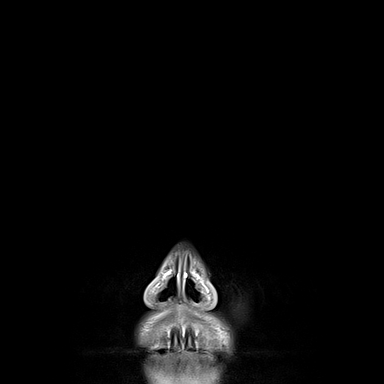
[im 32/32]
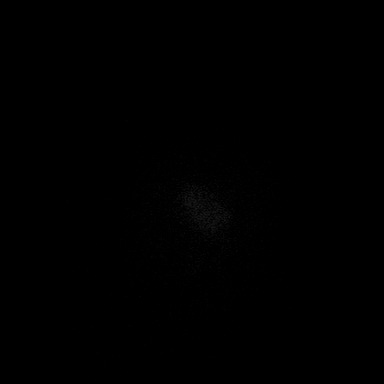

[Series 16: T1 post-contrast · axial · 1.0mm · 0.90mm/px · z∈[-103,+52]mm · 9 of 160 slices shown (1 of 3)]
[im 1/160]
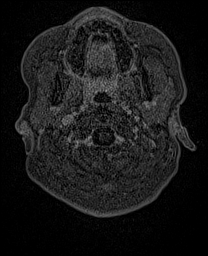
[im 20/160]
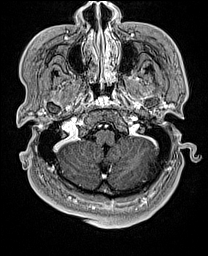
[im 40/160]
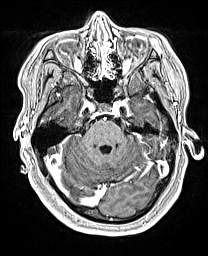
[im 60/160]
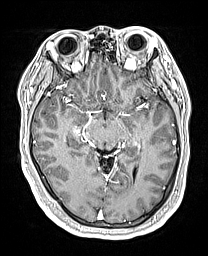
[im 80/160]
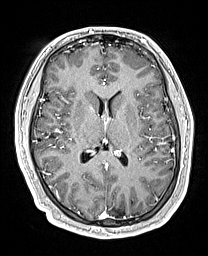
[im 100/160]
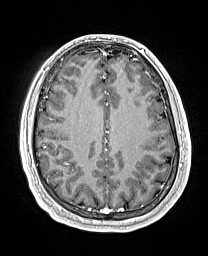
[im 120/160]
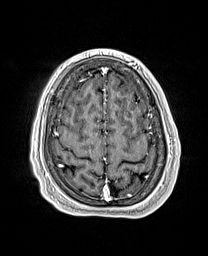
[im 140/160]
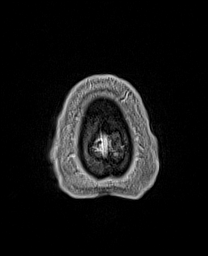
[im 160/160]
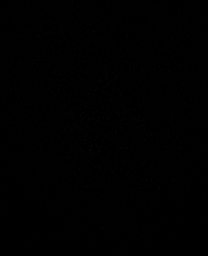

[Series 17: T1 · sagittal · 4.0mm · 0.90mm/px · 2 of 37 slices shown (3 of 4)]
[im 1/37]
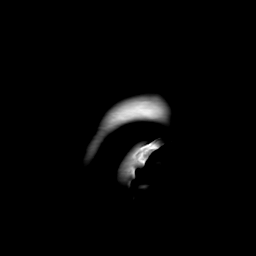
[im 37/37]
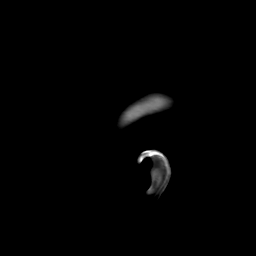

[Series 18: T1 · coronal · 4.0mm · 0.90mm/px · 2 of 46 slices shown (4 of 4)]
[im 1/46]
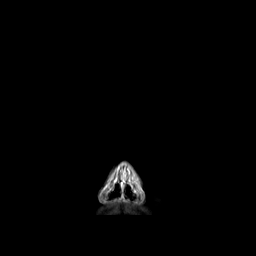
[im 46/46]
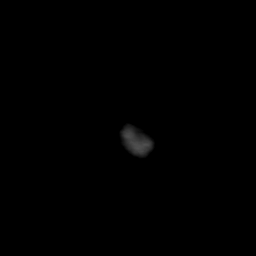

[Series 19: T1 post-contrast · coronal · 5.0mm · 0.43mm/px · 2 of 32 slices shown (2 of 3)]
[im 1/32]
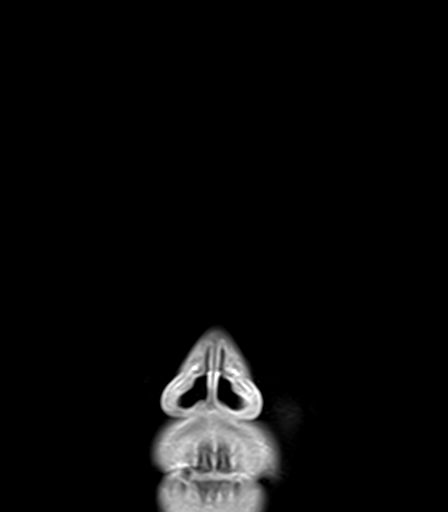
[im 32/32]
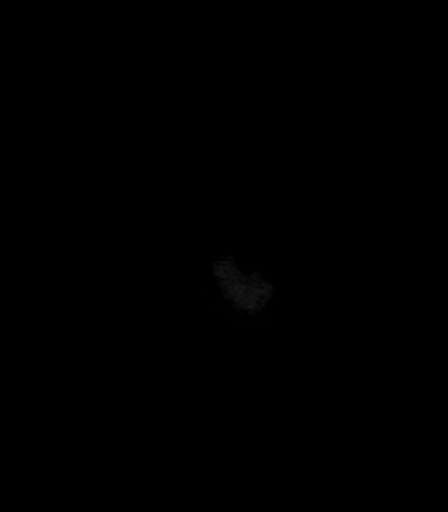

[Series 20: T1 post-contrast · sagittal · 5.0mm · 0.72mm/px · 1 of 24 slices shown (3 of 3)]
[im 1/24]
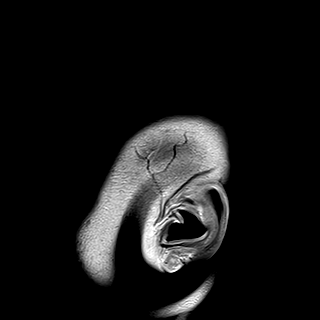

[48 of 48 positions shown; findings below may reference images not displayed]

FINDINGS: Brain: No acute infarct, mass effect or extra-axial collection. No
acute or chronic hemorrhage. Normal white matter signal, parenchymal
volume and CSF spaces. The midline structures are normal. There is
no abnormal contrast enhancement.

Vascular: Major flow voids are preserved.

Skull and upper cervical spine: Normal calvarium and skull base.
Visualized upper cervical spine and soft tissues are normal.

Sinuses/Orbits:No paranasal sinus fluid levels or advanced mucosal
thickening. No mastoid or middle ear effusion. Allowing for
differences in technique, the orbital abnormalities described on
yesterday's MRI are unchanged.
IMPRESSION: 1. Normal brain.
2. Unchanged appearance of the orbits since the recent orbital MRI,
allowing for differences in technique.

## 2021-10-04 MED ORDER — DIPHENHYDRAMINE HCL 25 MG PO CAPS
25.0000 mg | ORAL_CAPSULE | Freq: Once | ORAL | Status: AC | PRN
  Administered 2021-10-04: 25 mg via ORAL
  Filled 2021-10-04: qty 1

## 2021-10-04 MED ORDER — INFLUENZA VAC SPLIT QUAD 0.5 ML IM SUSY
0.5000 mL | PREFILLED_SYRINGE | INTRAMUSCULAR | Status: AC
  Administered 2021-10-05: 0.5 mL via INTRAMUSCULAR
  Filled 2021-10-04: qty 0.5

## 2021-10-04 MED ORDER — GADOBUTROL 1 MMOL/ML IV SOLN
10.0000 mL | Freq: Once | INTRAVENOUS | Status: AC | PRN
  Administered 2021-10-04: 10 mL via INTRAVENOUS

## 2021-10-04 MED ORDER — VANCOMYCIN HCL IN DEXTROSE 1-5 GM/200ML-% IV SOLN
1000.0000 mg | Freq: Two times a day (BID) | INTRAVENOUS | Status: DC
  Administered 2021-10-04 – 2021-10-05 (×2): 1000 mg via INTRAVENOUS
  Filled 2021-10-04 (×2): qty 200

## 2021-10-04 NOTE — Progress Notes (Signed)
PROGRESS NOTE    Christy Ortega  HTD:[id number] DOB: 1996/08/31 DOA: 10/03/2021 PCP: Pcp, No    No chief complaint on file.   Brief Narrative:  Christy Ortega is a 25 y.o. Hispanic female with no significant medical history presenting to the emergency room with a [place] of left eye pain that started yesterday and swelling with redness that started earlier today with associated tenderness.  She has been having excessive lacrimation. She denies any headache or blurry vision at this time. Left orbital CT showed mild thickening of the left superior rectus muscle with hazy stranding indicating orbital cellulitis.  MRI of the orbits with and without contrast ordered showing Left optic neuritis, with inflammatory stranding around the left optic nerve and the left lacrimal gland. 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 Primary possibilities are peripheral nerve schwannoma or cavernous venous malformation. Multiple enlarged upper cervical lymph nodes, measuring up to 10  mm. This is nonspecific but may be reactive.  Ophthalmology consulted by EDP, Dr. Allena Katz to see the patient today. Neurology consulted this morning.   Assessment & Plan:   Active Problems:   Cellulitis of left orbital region  Acute left orbital nonpurulent cellulitis Started the patient on broad-spectrum IV antibiotics, IV vancomycin and cefepime Pain controlled Ophthalmology consulted will await further recommendations. Afebrile blood work mild leukocytosis.   Mild hyponatremia Continue to monitor, asymptomatic    DVT prophylaxis: Lovenox Code Status: Full code Family Communication: None at bedside Disposition:   Status is: Inpatient  Remains inpatient appropriate because: further work up  by ophthalmology and IV antibiotics.         Consultants:  Opthalmology Neurology.   Procedures: none.  Antimicrobials:  Antibiotics Given (last 72 hours)     Date/Time Action Medication Dose Rate   10/03/21 2301 New Bag/Given   piperacillin-tazobactam (ZOSYN) IVPB 3.375 g 3.375 g 100 mL/hr   10/04/21 0113 New Bag/Given   vancomycin (VANCOREADY) IVPB 2000 mg/400 mL 2,000 mg 200 mL/hr   10/04/21 0532 New Bag/Given   ceFEPIme (MAXIPIME) 2 g in sodium chloride 0.9 % 100 mL IVPB 2 g 200 mL/hr   10/04/21 0954 New Bag/Given   vancomycin (VANCOREADY) IVPB 1250 mg/250 mL 1,250 mg 166.7 mL/hr         Subjective: No pain in the eye,no blurry vision.   Objective: Vitals:   10/03/21 2221 10/04/21 0148 10/04/21 0511 10/04/21 0955  BP: 124/78 (!) 111/94 109/64 120/76  Pulse: 63 74 64 70  Resp: 17 16 16 16   Temp:  98.7 F (37.1 C) 98 F (36.7 C) 98.3 F (36.8 C)  TempSrc:    Oral  SpO2: 100% 100% 100% 100%  Weight:  97.5 kg    Height:  5' 6.93" (1.7 m)      Intake/Output Summary (Last 24 hours) at 10/04/2021 1106 Last data filed at 10/04/2021 0600 Gross per 24 hour  Intake 1084.32 ml  Output --  Net 1084.32 ml   Filed Weights   10/03/21 1625 10/04/21 0148  Weight: 95 kg 97.5 kg    Examination:  General exam: Appears calm and comfortable . Has left eye swelling and left eye is shut. Tenderness has improved. No erythema.  Respiratory system: Clear to auscultation. Respiratory effort normal. Cardiovascular system: S1 & S2 heard, RRR. No JVD,  No pedal edema. Gastrointestinal system: Abdomen is nondistended, soft and nontender. Normal bowel  sounds heard. Central nervous system: Alert and oriented. No focal neurological deficits. Extremities: Symmetric 5 x 5 power. Skin: No rashes, lesions or ulcers Psychiatry:  Mood & affect appropriate.     Data Reviewed: I have personally reviewed following labs and imaging studies  CBC: Recent Labs  Lab 10/03/21 1659 10/04/21 0308  WBC 8.4 11.8*  NEUTROABS  4.7  --   HGB 11.8* 11.5*  HCT 37.5 36.9  MCV 87.0 88.1  PLT 376 353    Basic Metabolic Panel: Recent Labs  Lab 10/03/21 1659 10/04/21 0308  NA 138 134*  K 4.6 3.5  CL 108 104  CO2 25 21*  GLUCOSE 100* 104*  BUN 8 7  CREATININE 0.48 0.52  CALCIUM 8.7* 8.1*    GFR: Estimated Creatinine Clearance: 126.4 mL/min (by C-G formula based on SCr of 0.52 mg/dL).  Liver Function Tests: Recent Labs  Lab 10/03/21 1659  AST 12*  ALT 13  ALKPHOS 94  BILITOT 0.3  PROT 7.7  ALBUMIN 4.3    CBG: No results for input(s): GLUCAP in the last 168 hours.   Recent Results (from the past 240 hour(s))  Resp Panel by RT-PCR (Flu A&B, Covid) Nasopharyngeal Swab     Status: None   Collection Time: 10/03/21 10:57 PM   Specimen: Nasopharyngeal Swab; Nasopharyngeal(NP) swabs in vial transport medium  Result Value Ref Range Status   SARS Coronavirus 2 by RT PCR NEGATIVE NEGATIVE Final    Comment: (NOTE) SARS-CoV-2 target nucleic acids are NOT DETECTED.  The SARS-CoV-2 RNA is generally detectable in upper respiratory specimens during the acute phase of infection. The lowest concentration of SARS-CoV-2 viral copies this assay can detect is 138 copies/mL. A negative result does not preclude SARS-Cov-2 infection and should not be used as the sole basis for treatment or other patient management decisions. A negative result may occur with  improper specimen collection/handling, submission of specimen other than nasopharyngeal swab, presence of viral mutation(s) within the areas targeted by this assay, and inadequate number of viral copies(<138 copies/mL). A negative result must be combined with clinical observations, patient history, and epidemiological information. The expected result is Negative.  Fact Sheet for Patients:  BloggerCourse.com  Fact Sheet for Healthcare Providers:  SeriousBroker.it  This test is no t yet approved or cleared  by the [place] FDA and  has been authorized for detection and/or diagnosis of SARS-CoV-2 by FDA under an Emergency Use Authorization (EUA). This EUA will remain  in effect (meaning this test can be used) for the duration of the COVID-19 declaration under Section 564(b)(1) of the Act, 21 U.S.C.section 360bbb-3(b)(1), unless the authorization is terminated  or revoked sooner.       Influenza A by PCR NEGATIVE NEGATIVE Final   Influenza B by PCR NEGATIVE NEGATIVE Final    Comment: (NOTE) The Xpert Xpress SARS-CoV-2/FLU/RSV plus assay is intended as an aid in the diagnosis of influenza from Nasopharyngeal swab specimens and should not be used as a sole basis for treatment. Nasal washings and aspirates are unacceptable for Xpert Xpress SARS-CoV-2/FLU/RSV testing.  Fact Sheet for Patients: BloggerCourse.com  Fact Sheet for Healthcare Providers: SeriousBroker.it  This test is not yet approved or cleared by the [place] FDA and has been authorized for detection and/or diagnosis of SARS-CoV-2 by FDA under an Emergency Use Authorization (EUA). This EUA will remain in effect (meaning this test can be used) for the duration of the COVID-19 declaration under Section 564(b)(1) of the Act, 21 U.S.C. section 360bbb-3(b)(1),  unless the authorization is terminated or revoked.  Performed at [facility], [address], [place], [place] [address]          Radiology Studies: CT Orbits W Contrast  Result Date: 10/03/2021 CLINICAL DATA:  Orbital cellulitis EXAM: CT ORBITS WITH CONTRAST TECHNIQUE: Multidetector CT images was performed according to the standard protocol following intravenous contrast administration. CONTRAST:  53mL OMNIPAQUE IOHEXOL 350 MG/ML SOLN COMPARISON:  No pertinent prior exam. FINDINGS: Orbits: Inferior and lateral to the left orbit is a dense nodular area measuring 1.2 x 0.9 cm. There is no  surrounding inflammatory change. Additionally, there is mild thickening of the left superior rectus muscle with hazy stranding in the adjacent fat. The optic nerves and globes appear normal. Visible paranasal sinuses: Clear. Soft tissues: Normal. Osseous: No fracture or aggressive lesion. Limited intracranial: No acute or significant finding. IMPRESSION: 1. Mild thickening of the left superior rectus muscle with hazy stranding in the adjacent fat, possibly indicating orbital cellulitis. This may indicate an inflammatory process, such as idiopathic orbital inflammation (pseudotumor). Dedicated MRI orbits with and without contrast recommended. 2. 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 Electronically Signed   By: Deatra Robinson M.D.   On: 10/03/2021 21:55   MR ORBITS W WO CONTRAST  Result Date: 10/04/2021 CLINICAL DATA:  Left eye pain EXAM: MRI OF THE ORBITS WITHOUT AND WITH CONTRAST TECHNIQUE: Multiplanar, multi-echo pulse sequences of the orbits and surrounding structures were acquired including fat saturation techniques, before and after intravenous contrast administration. CONTRAST:  54mL GADAVIST GADOBUTROL 1 MMOL/ML IV SOLN COMPARISON:  No pertinent prior exam. FINDINGS: Orbits: There is inflammatory stranding around the left optic nerve, but signal within the optic nerve itself is normal. The left superior rectus muscle is enlarged and edematous with increased contrast enhancement. The other left extraocular muscles are mildly enlarged with some sparing of the inferior oblique. There is no intraorbital fluid collection. Mild edema of the left lacrimal gland. Visualized sinuses: Clear Soft tissues: Inferior and lateral to the left orbit, there is a diffusely contrast-enhancing nodule that measures 1.6 x 0.6 cm. This nodule shows low intrinsic T1 weighted signal and high T2-weighted signal. There is no inflammatory  change surrounding the nodule. There are multiple enlarged upper cervical lymph nodes, measuring up to 10 mm. Limited intracranial: Normal IMPRESSION: 1. Left optic neuritis, with inflammatory stranding around the left optic nerve and the left lacrimal gland. 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 2. Peripherally contrast-enhancing nodule inferior and lateral to the left orbit, measuring 1.6 x 0.6 cm. Primary possibilities are peripheral nerve schwannoma or cavernous venous malformation. 3. Multiple enlarged upper cervical lymph nodes, measuring up to 10 mm. This is nonspecific but may be reactive. Electronically Signed   By: Deatra Robinson M.D.   On: 10/04/2021 00:35        Scheduled Meds:  enoxaparin (LOVENOX) injection  40 mg Subcutaneous Q24H   [START ON 10/05/2021] influenza vac split quadrivalent PF  0.5 mL Intramuscular Tomorrow-1000   Continuous Infusions:  sodium chloride 75 mL/hr at 10/04/21 0410   ceFEPime (MAXIPIME) IV 2 g (10/04/21 0532)   vancomycin 1,250 mg (10/04/21 0954)     LOS: 1 day        Kathlen Mody, MD [facility]   To contact the attending provider between 7A-7P or the covering provider during after hours 7P-7A, please log  into the web site [email or web address] and access using universal [place] password for that web site. If you do not have the password, please call the hospital operator.  10/04/2021, 11:06 AM

## 2021-10-04 NOTE — Plan of Care (Signed)
Spanish E-interpreter service used for completion of admission process.  Patient agrees to report changes in condition and request E-interpreter to understand care given and to express her questions and needs.  Patient declined need for interpreter for bedside shift change report at this time for 07:00.

## 2021-10-04 NOTE — Progress Notes (Signed)
Pharmacy Antibiotic Note  Christy Ortega is a 25 y.o. female admitted on 10/03/2021 with cellulitis.  Pharmacy has been consulted for Vancomycin & Cefepime dosing.  Plan: Cefepime 2gm IV q8h Change vancomycin 1g IV q12h to target AUC 400-550 Monitor renal function and cx data  F/U ophthalmology recs  Height: 5' 6.93" (170 cm) Weight: 97.5 kg (214 lb 15.2 oz) IBW/kg (Calculated) : 61.44  Temp (24hrs), Avg:98.2 F (36.8 C), Min:97.9 F (36.6 C), Max:98.7 F (37.1 C)  Recent Labs  Lab 10/03/21 1659 10/04/21 0308  WBC 8.4 11.8*  CREATININE 0.48 0.52     Estimated Creatinine Clearance: 126.4 mL/min (by C-G formula based on SCr of 0.52 mg/dL).    No Known Allergies  Antimicrobials this admission: 11/2 Vancomycin >>  11/2 Cefepime >>  11/1 Zosyn x1  Dose adjustments this admission:  Microbiology results: BCx: sent  Thank you for allowing pharmacy to be a part of this patient's care.  Enzo Bi, PharmD, BCPS, BCIDP Clinical Pharmacist 10/04/2021 12:09 PM

## 2021-10-05 LAB — CBC WITH DIFFERENTIAL/PLATELET
Abs Immature Granulocytes: 0.02 10*3/uL (ref 0.00–0.07)
Basophils Absolute: 0 10*3/uL (ref 0.0–0.1)
Basophils Relative: 0 %
Eosinophils Absolute: 0.1 10*3/uL (ref 0.0–0.5)
Eosinophils Relative: 2 %
HCT: 34 % — ABNORMAL LOW (ref 36.0–46.0)
Hemoglobin: 10.8 g/dL — ABNORMAL LOW (ref 12.0–15.0)
Immature Granulocytes: 0 %
Lymphocytes Relative: 36 %
Lymphs Abs: 2.9 10*3/uL (ref 0.7–4.0)
MCH: 27.3 pg (ref 26.0–34.0)
MCHC: 31.8 g/dL (ref 30.0–36.0)
MCV: 85.9 fL (ref 80.0–100.0)
Monocytes Absolute: 0.3 10*3/uL (ref 0.1–1.0)
Monocytes Relative: 4 %
Neutro Abs: 4.7 10*3/uL (ref 1.7–7.7)
Neutrophils Relative %: 58 %
Platelets: 322 10*3/uL (ref 150–400)
RBC: 3.96 MIL/uL (ref 3.87–5.11)
RDW: 12.6 % (ref 11.5–15.5)
WBC: 8.1 10*3/uL (ref 4.0–10.5)
nRBC: 0 % (ref 0.0–0.2)

## 2021-10-05 LAB — C-REACTIVE PROTEIN: CRP: 0.8 mg/dL (ref <1.0)

## 2021-10-05 LAB — SEDIMENTATION RATE: Sed Rate: 15 mm/hr (ref 0–22)

## 2021-10-05 MED ORDER — DOXYCYCLINE MONOHYDRATE 100 MG PO TABS: 100.0000 mg | ORAL_TABLET | Freq: Two times a day (BID) | ORAL | 0 refills | Status: AC

## 2021-10-05 MED ORDER — AMOXICILLIN-POT CLAVULANATE 875-125 MG PO TABS: 1.0000 | ORAL_TABLET | Freq: Two times a day (BID) | ORAL | 0 refills | Status: AC

## 2021-10-05 NOTE — Plan of Care (Signed)
  Problem: Pain Managment: Goal: General experience of comfort will improve Outcome: Progressing   

## 2021-10-05 NOTE — Discharge Summary (Signed)
Physician Discharge Summary  Christy Ortega PPJ:[id number] DOB: 12-12-1995 DOA: 10/03/2021  PCP: Oneita Hurt, No  Admit date: 10/03/2021 Discharge date: 10/05/2021  Admitted From:Home.  Disposition:  Home.   Recommendations for Outpatient Follow-up:  Follow up with PCP in 1-2 weeks Please obtain BMP/CBC in one week Please follow up with Dr Allena Katz today asap after discharge.   Discharge Condition: stable.  CODE STATUS:full code.  Diet recommendation:  Regular  Brief/Interim Summary: Christy Ortega is a 25 y.o. Hispanic female with no significant medical history presenting to the emergency room with a Kalisetti of left eye pain that started yesterday and swelling with redness that started earlier today with associated tenderness.  She has been having excessive lacrimation. She denies any headache or blurry vision at this time. Left orbital CT showed mild thickening of the left superior rectus muscle with hazy stranding indicating orbital cellulitis.  MRI of the orbits with and without contrast ordered showing Left optic neuritis, with inflammatory stranding around the left optic nerve and the left lacrimal gland. 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 Primary possibilities are peripheral nerve schwannoma or cavernous venous malformation. Multiple enlarged upper cervical lymph nodes, measuring up to 10  mm. This is nonspecific but may be reactive.   Ophthalmology consulted by EDP,  called Dr Allena Katz ,  Neurology consulted this morning, suggested ophthalmology consult and outside the scope of their management.    Discharge Diagnoses:  Active Problems:   Cellulitis of left orbital region  Acute left orbital nonpurulent cellulitis Started the patient on broad-spectrum IV antibiotics, IV vancomycin and cefepime,  transitioned to doxycycline and augmentin for 2 weeks after discussing with ID Dr Luciana Axe.  Ophthalmology consulted, recommended follow up in the office and referral to [facility] specialist Dr Daphine Deutscher.  Discussed with Dr Wilford Corner with neurology, suggested ophthalmology consult and further work up for auto immune etiology.  Dicussed the plan with the patient using translator and she was seen in th ophthalmology office and sent to [facility] ED To be seen by orbital specialist.   Pain controlled Ophthalmology consulted will await further recommendations. Afebrile blood work mild leukocytosis.     Mild hyponatremia Continue to monitor, asymptomatic  Discharge Instructions  Discharge Instructions     Diet - low sodium heart healthy   Complete by: As directed    Discharge instructions   Complete by: As directed    Please see Dr Allena Katz in the office today on discharge, who will give recommendations after complete evaluation to see the orbital specialist at [facility].      Allergies as of 10/05/2021   No Known Allergies      Medication List     TAKE these medications    amoxicillin-clavulanate 875-125 MG tablet Commonly known as: Augmentin Take 1 tablet by mouth every 12 (twelve) hours for 14 days.   doxycycline 100 MG tablet Commonly known as: ADOXA Take 1 tablet (100 mg total) by mouth 2 (two) times daily for 14 days.        Follow-up Information     Carmela Rima, MD Follow up on 10/05/2021.   Specialty: Ophthalmology Why: today, please see him ASAP. Contact information: [address] [place] [address] [phone]                No Known Allergies  Consultations: Ophthalmology Neurology.    Procedures/Studies:  MR BRAIN W WO CONTRAST  Result Date: 10/05/2021 CLINICAL DATA:  Left orbital inflammation EXAM: MRI HEAD WITHOUT AND WITH CONTRAST TECHNIQUE: Multiplanar, multiecho pulse sequences of the brain and surrounding structures were  obtained without and with intravenous contrast. CONTRAST:  10mL GADAVIST GADOBUTROL 1 MMOL/ML IV SOLN COMPARISON:  MRI orbits 10/03/2021 FINDINGS: Brain: No acute infarct, mass effect or extra-axial collection. No acute or chronic hemorrhage. Normal white matter signal, parenchymal volume and CSF spaces. The midline structures are normal. There is no abnormal contrast enhancement. Vascular: Major flow voids are preserved. Skull and upper cervical spine: Normal calvarium and skull base. Visualized upper cervical spine and soft tissues are normal. Sinuses/Orbits:No paranasal sinus fluid levels or advanced mucosal thickening. No mastoid or middle ear effusion. Allowing for differences in technique, the orbital abnormalities described on yesterday's MRI are unchanged. IMPRESSION: 1. Normal brain. 2. Unchanged appearance of the orbits since the recent orbital MRI, allowing for differences in technique. Electronically Signed   By: Deatra Robinson M.D.   On: 10/05/2021 00:13   CT Orbits W Contrast  Result Date: 10/03/2021 CLINICAL DATA:  Orbital cellulitis EXAM: CT ORBITS WITH CONTRAST TECHNIQUE: Multidetector CT images was performed according to the standard protocol following intravenous contrast administration. CONTRAST:  54mL OMNIPAQUE IOHEXOL 350 MG/ML SOLN COMPARISON:  No pertinent prior exam. FINDINGS: Orbits: Inferior and lateral to the left orbit is a dense nodular area measuring 1.2 x 0.9 cm. There is no surrounding inflammatory change. Additionally, there is mild thickening of the left superior rectus muscle with hazy stranding in the adjacent fat. The optic nerves and globes appear normal. Visible paranasal sinuses: Clear. Soft tissues: Normal. Osseous: No fracture or aggressive lesion. Limited intracranial: No acute or significant finding. IMPRESSION: 1. Mild thickening of the left superior rectus muscle with hazy stranding in the adjacent fat, possibly indicating orbital cellulitis. This may indicate an  inflammatory process, such as idiopathic orbital inflammation (pseudotumor). Dedicated MRI orbits with and without contrast recommended. 2. 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 Electronically Signed   By: Deatra Robinson M.D.   On: 10/03/2021 21:55   MR ORBITS W WO CONTRAST  Result Date: 10/04/2021 CLINICAL DATA:  Left eye pain EXAM: MRI OF THE ORBITS WITHOUT AND WITH CONTRAST TECHNIQUE: Multiplanar, multi-echo pulse sequences of the orbits and surrounding structures were acquired including fat saturation techniques, before and after intravenous contrast administration. CONTRAST:  78mL GADAVIST GADOBUTROL 1 MMOL/ML IV SOLN COMPARISON:  No pertinent prior exam. FINDINGS: Orbits: There is inflammatory stranding around the left optic nerve, but signal within the optic nerve itself is normal. The left superior rectus muscle is enlarged and edematous with increased contrast enhancement. The other left extraocular muscles are mildly enlarged with some sparing of the inferior oblique. There is no intraorbital fluid collection. Mild edema of the left lacrimal gland. Visualized sinuses: Clear Soft tissues: Inferior and lateral to the left orbit, there is a diffusely contrast-enhancing nodule that measures 1.6 x 0.6 cm. This nodule shows low intrinsic T1 weighted signal and high T2-weighted signal. There is no inflammatory change surrounding the nodule. There are multiple enlarged upper cervical lymph nodes, measuring up to 10 mm. Limited intracranial: Normal IMPRESSION: 1. Left optic neuritis, with inflammatory stranding around the left optic nerve and the left lacrimal gland. 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 2. Peripherally contrast-enhancing nodule inferior and lateral to the left orbit,  measuring 1.6 x 0.6 cm. Primary possibilities are peripheral nerve schwannoma or  cavernous venous malformation. 3. Multiple enlarged upper cervical lymph nodes, measuring up to 10 mm. This is nonspecific but may be reactive. Electronically Signed   By: Deatra Robinson M.D.   On: 10/04/2021 00:35      Subjective: Eye pain, redness and tenderness has resolved. She is able to open the left, eye. She denies any diplopia. No visual field defects. No watering of the eye. No headaches or dizziness. She denies any nausea, vomiting or abdominal pain.   Discharge Exam: Vitals:   10/05/21 0531 10/05/21 0800  BP: 114/68 115/72  Pulse: 73 63  Resp: 16 12  Temp: 98.6 F (37 C) 98.1 F (36.7 C)  SpO2: 100% 100%   Vitals:   10/04/21 1809 10/04/21 2058 10/05/21 0531 10/05/21 0800  BP: 118/66 (!) 115/55 114/68 115/72  Pulse: 67 65 73 63  Resp: Temp: 98.5 F (36.9 C) 98.5 F (36.9 C) 98.6 F (37 C) 98.1 F (36.7 C)  TempSrc: Oral   Oral  SpO2: 100% 100% 100% 100%  Weight:      Height:        General: Pt is alert, awake, not in acute distress Cardiovascular: RRR, S1/S2 +, no rubs, no gallops Respiratory: CTA bilaterally, no wheezing, no rhonchi Abdominal: Soft, NT, ND, bowel sounds + Extremities: no edema, no cyanosis    The results of significant diagnostics from this hospitalization (including imaging, microbiology, ancillary and laboratory) are listed below for reference.     Microbiology: Recent Results (from the past 240 hour(s))  Resp Panel by RT-PCR (Flu A&B, Covid) Nasopharyngeal Swab     Status: None   Collection Time: 10/03/21 10:57 PM   Specimen: Nasopharyngeal Swab; Nasopharyngeal(NP) swabs in vial transport medium  Result Value Ref Range Status   SARS Coronavirus 2 by RT PCR NEGATIVE NEGATIVE Final    Comment: (NOTE) SARS-CoV-2 target nucleic acids are NOT DETECTED.  The SARS-CoV-2 RNA is generally detectable in upper respiratory specimens during the acute phase of infection. The lowest concentration of SARS-CoV-2 viral copies this  assay can detect is 138 copies/mL. A negative result does not preclude SARS-Cov-2 infection and should not be used as the sole basis for treatment or other patient management decisions. A negative result may occur with  improper specimen collection/handling, submission of specimen other than nasopharyngeal swab, presence of viral mutation(s) within the areas targeted by this assay, and inadequate number of viral copies(<138 copies/mL). A negative result must be combined with clinical observations, patient history, and epidemiological information. The expected result is Negative.  Fact Sheet for Patients:  BloggerCourse.com  Fact Sheet for Healthcare Providers:  SeriousBroker.it  This test is no t yet approved or cleared by the [place] FDA and  has been authorized for detection and/or diagnosis of SARS-CoV-2 by FDA under an Emergency Use Authorization (EUA). This EUA will remain  in effect (meaning this test can be used) for the duration of the COVID-19 declaration under Section 564(b)(1) of the Act, 21 U.S.C.section 360bbb-3(b)(1), unless the authorization is terminated  or revoked sooner.       Influenza A by PCR NEGATIVE NEGATIVE Final   Influenza B by PCR NEGATIVE NEGATIVE Final    Comment: (NOTE) The Xpert Xpress SARS-CoV-2/FLU/RSV plus assay is intended as an aid in the diagnosis of influenza from Nasopharyngeal swab specimens and should not be used as a sole basis for treatment. Nasal washings and  aspirates are unacceptable for Xpert Xpress SARS-CoV-2/FLU/RSV testing.  Fact Sheet for Patients: BloggerCourse.com  Fact Sheet for Healthcare Providers: SeriousBroker.it  This test is not yet approved or cleared by the [place] FDA and has been authorized for detection and/or diagnosis of SARS-CoV-2 by FDA under an Emergency Use Authorization (EUA). This EUA will  remain in effect (meaning this test can be used) for the duration of the COVID-19 declaration under Section 564(b)(1) of the Act, 21 U.S.C. section 360bbb-3(b)(1), unless the authorization is terminated or revoked.  Performed at [facility], [address] [place], [place] [address]   Culture, blood (routine x 2)     Status: None (Preliminary result)   Collection Time: 10/04/21  1:16 AM   Specimen: BLOOD  Result Value Ref Range Status   Specimen Description   Final    BLOOD LEFT ARM Performed at [facility], [address], [place], [place] [address]    Special Requests   Final    BOTTLES DRAWN AEROBIC AND ANAEROBIC Blood Culture adequate volume Performed at [facility], [address], [place], [place] [address]    Culture   Final    NO GROWTH 1 DAY Performed at [facility], [address] [place], [place] [address]    Report Status PENDING  Incomplete  Culture, blood (routine x 2)     Status: None (Preliminary result)   Collection Time: 10/04/21  3:08 AM   Specimen: BLOOD  Result Value Ref Range Status   Specimen Description   Final    BLOOD RIGHT ARM Performed at [facility], [address], [place], [place] [address]    Special Requests   Final    BOTTLES DRAWN AEROBIC ONLY Blood Culture adequate volume Performed at [facility], [address], [place], [place] [address]    Culture   Final    NO GROWTH 1 DAY Performed at [facility], [address] [place], [place] [address]    Report Status PENDING  Incomplete     Labs: BNP (last 3 results) No results for input(s): BNP in the last 8760 hours. Basic Metabolic Panel: Recent Labs  Lab 10/03/21 1659 10/04/21 0308  NA 138 134*  K 4.6 3.5  CL 108 104  CO2 25 21*  GLUCOSE 100* 104*  BUN 8 7  CREATININE 0.48 0.52  CALCIUM 8.7* 8.1*   Liver Function Tests: Recent Labs  Lab 10/03/21 1659   AST 12*  ALT 13  ALKPHOS 94  BILITOT 0.3  PROT 7.7  ALBUMIN 4.3   No results for input(s): LIPASE, AMYLASE in the last 168 hours. No results for input(s): AMMONIA in the last 168 hours. CBC: Recent Labs  Lab 10/03/21 1659 10/04/21 0308 10/05/21 0939  WBC 8.4 11.8* 8.1  NEUTROABS 4.7  --  4.7  HGB 11.8* 11.5* 10.8*  HCT 37.5 36.9 34.0*  MCV 87.0 88.1 85.9  PLT 376 353 322   Cardiac Enzymes: No results for input(s): CKTOTAL, CKMB, CKMBINDEX, TROPONINI in the last 168 hours. BNP: Invalid input(s): POCBNP CBG: No results for input(s): GLUCAP in the last 168 hours. D-Dimer No results for input(s): DDIMER in the last 72 hours. Hgb A1c No results for input(s): HGBA1C in the last 72 hours. Lipid Profile No results for input(s): CHOL, HDL, LDLCALC, TRIG, CHOLHDL, LDLDIRECT in the last 72 hours. Thyroid function studies No results for input(s): TSH, T4TOTAL, T3FREE, THYROIDAB in the last  72 hours.  Invalid input(s): FREET3 Anemia work up No results for input(s): VITAMINB12, FOLATE, FERRITIN, TIBC, IRON, RETICCTPCT in the last 72 hours. Urinalysis No results found for: COLORURINE, APPEARANCEUR, LABSPEC, PHURINE, GLUCOSEU, HGBUR, BILIRUBINUR, KETONESUR, PROTEINUR, UROBILINOGEN, NITRITE, LEUKOCYTESUR Sepsis Labs Invalid input(s): PROCALCITONIN,  WBC,  LACTICIDVEN Microbiology Recent Results (from the past 240 hour(s))  Resp Panel by RT-PCR (Flu A&B, Covid) Nasopharyngeal Swab     Status: None   Collection Time: 10/03/21 10:57 PM   Specimen: Nasopharyngeal Swab; Nasopharyngeal(NP) swabs in vial transport medium  Result Value Ref Range Status   SARS Coronavirus 2 by RT PCR NEGATIVE NEGATIVE Final    Comment: (NOTE) SARS-CoV-2 target nucleic acids are NOT DETECTED.  The SARS-CoV-2 RNA is generally detectable in upper respiratory specimens during the acute phase of infection. The lowest concentration of SARS-CoV-2 viral copies this assay can detect is 138 copies/mL. A  negative result does not preclude SARS-Cov-2 infection and should not be used as the sole basis for treatment or other patient management decisions. A negative result may occur with  improper specimen collection/handling, submission of specimen other than nasopharyngeal swab, presence of viral mutation(s) within the areas targeted by this assay, and inadequate number of viral copies(<138 copies/mL). A negative result must be combined with clinical observations, patient history, and epidemiological information. The expected result is Negative.  Fact Sheet for Patients:  BloggerCourse.com  Fact Sheet for Healthcare Providers:  SeriousBroker.it  This test is no t yet approved or cleared by the [place] FDA and  has been authorized for detection and/or diagnosis of SARS-CoV-2 by FDA under an Emergency Use Authorization (EUA). This EUA will remain  in effect (meaning this test can be used) for the duration of the COVID-19 declaration under Section 564(b)(1) of the Act, 21 U.S.C.section 360bbb-3(b)(1), unless the authorization is terminated  or revoked sooner.       Influenza A by PCR NEGATIVE NEGATIVE Final   Influenza B by PCR NEGATIVE NEGATIVE Final    Comment: (NOTE) The Xpert Xpress SARS-CoV-2/FLU/RSV plus assay is intended as an aid in the diagnosis of influenza from Nasopharyngeal swab specimens and should not be used as a sole basis for treatment. Nasal washings and aspirates are unacceptable for Xpert Xpress SARS-CoV-2/FLU/RSV testing.  Fact Sheet for Patients: BloggerCourse.com  Fact Sheet for Healthcare Providers: SeriousBroker.it  This test is not yet approved or cleared by the [place] FDA and has been authorized for detection and/or diagnosis of SARS-CoV-2 by FDA under an Emergency Use Authorization (EUA). This EUA will remain in effect (meaning this test can  be used) for the duration of the COVID-19 declaration under Section 564(b)(1) of the Act, 21 U.S.C. section 360bbb-3(b)(1), unless the authorization is terminated or revoked.  Performed at [facility], [address], [place], [place] [address]   Culture, blood (routine x 2)     Status: None (Preliminary result)   Collection Time: 10/04/21  1:16 AM   Specimen: BLOOD  Result Value Ref Range Status   Specimen Description   Final    BLOOD LEFT ARM Performed at [facility], [address] [place], [place] [address]    Special Requests   Final    BOTTLES DRAWN AEROBIC AND ANAEROBIC Blood Culture adequate volume Performed at [facility], [address] [place], [place] [address]    Culture   Final    NO GROWTH 1 DAY Performed at [facility], [address] [place],  [place] [address]    Report Status PENDING  Incomplete  Culture, blood (routine x 2)     Status: None (Preliminary result)   Collection Time: 10/04/21  3:08 AM   Specimen: BLOOD  Result Value Ref Range Status   Specimen Description   Final    BLOOD RIGHT ARM Performed at [facility], [address], [place], [place] [address]    Special Requests   Final    BOTTLES DRAWN AEROBIC ONLY Blood Culture adequate volume Performed at [facility], [address] [place], [place] [address]    Culture   Final    NO GROWTH 1 DAY Performed at [facility], [address] [place], [place] [address]    Report Status PENDING  Incomplete     Time coordinating discharge: 38 minutes  SIGNED:   Kathlen Mody, MD  [facility] 10/05/2021, 12:48 PM

## 2021-10-05 NOTE — Plan of Care (Signed)
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 This does not seem to be a MS or NMO optic neuritis. Ophthalmology consult at this time would be of the highest yield. May need subspeciality ophthalmology consult, possibly at an academic institution. Infectious vs autoimmune etiologies need to be pursued to come to a diagnosis and to formulate an appropriate treatment plan, which is outside neurological scope of practice. Brain MRI is without evidence of demyelination making it less likely to be neurological. Discussed with Dr. Blake Divine.  -- Milon Dikes, MD Neurologist [facility] Pager: [phone]

## 2021-10-05 NOTE — Progress Notes (Signed)
Patient discharged to home w/ family. Given all belongings, instructions. Reinforced that patient needs to go straight to Dr Allena Katz office. Patient verbalized understanding. Escorted to front waiting area, patient preferred to wait for sister downstairs.

## 2021-10-09 LAB — CULTURE, BLOOD (ROUTINE X 2)
Culture: NO GROWTH
Culture: NO GROWTH
Special Requests: ADEQUATE
Special Requests: ADEQUATE
